# Patient Record
Sex: Female | Born: 1945 | Race: White | Hispanic: No
Health system: Southern US, Community
[De-identification: ages and names within clinical notes are randomized; demographics above are authoritative.]

---

## 1997-11-24 ENCOUNTER — Emergency Department (HOSPITAL_COMMUNITY): Admission: EM | Admit: 1997-11-24 | Discharge: 1997-11-24 | Payer: Self-pay

## 1997-12-31 ENCOUNTER — Emergency Department (HOSPITAL_COMMUNITY): Admission: EM | Admit: 1997-12-31 | Discharge: 1997-12-31 | Payer: Self-pay | Admitting: Emergency Medicine

## 1999-03-03 ENCOUNTER — Emergency Department (HOSPITAL_COMMUNITY): Admission: EM | Admit: 1999-03-03 | Discharge: 1999-03-03 | Payer: Self-pay | Admitting: Emergency Medicine

## 1999-03-03 ENCOUNTER — Encounter: Payer: Self-pay | Admitting: Emergency Medicine

## 2004-04-08 ENCOUNTER — Emergency Department: Payer: Self-pay | Admitting: General Practice

## 2004-04-20 ENCOUNTER — Emergency Department: Payer: Self-pay | Admitting: Internal Medicine

## 2004-04-25 ENCOUNTER — Emergency Department: Payer: Self-pay | Admitting: Emergency Medicine

## 2004-05-10 ENCOUNTER — Emergency Department: Payer: Self-pay | Admitting: Emergency Medicine

## 2004-05-28 ENCOUNTER — Inpatient Hospital Stay: Payer: Self-pay

## 2004-05-29 ENCOUNTER — Other Ambulatory Visit: Payer: Self-pay

## 2004-06-10 ENCOUNTER — Inpatient Hospital Stay: Payer: Self-pay | Admitting: Internal Medicine

## 2004-08-10 ENCOUNTER — Emergency Department: Payer: Self-pay | Admitting: General Practice

## 2004-08-23 ENCOUNTER — Inpatient Hospital Stay: Payer: Self-pay

## 2004-09-08 ENCOUNTER — Inpatient Hospital Stay: Payer: Self-pay | Admitting: Anesthesiology

## 2004-09-21 ENCOUNTER — Emergency Department: Payer: Self-pay | Admitting: Emergency Medicine

## 2004-10-30 ENCOUNTER — Emergency Department: Payer: Self-pay | Admitting: Internal Medicine

## 2004-11-17 ENCOUNTER — Emergency Department: Payer: Self-pay | Admitting: Emergency Medicine

## 2005-02-09 ENCOUNTER — Emergency Department: Payer: Self-pay | Admitting: Emergency Medicine

## 2005-05-13 ENCOUNTER — Emergency Department: Payer: Self-pay | Admitting: Unknown Physician Specialty

## 2005-05-14 ENCOUNTER — Other Ambulatory Visit: Payer: Self-pay

## 2005-05-14 ENCOUNTER — Inpatient Hospital Stay: Payer: Self-pay | Admitting: Unknown Physician Specialty

## 2006-08-15 ENCOUNTER — Emergency Department: Payer: Self-pay | Admitting: Emergency Medicine

## 2008-10-09 ENCOUNTER — Emergency Department: Payer: Self-pay

## 2009-01-12 ENCOUNTER — Ambulatory Visit: Payer: Self-pay | Admitting: Family Medicine

## 2009-01-14 ENCOUNTER — Ambulatory Visit: Payer: Self-pay | Admitting: Family Medicine

## 2009-02-05 ENCOUNTER — Ambulatory Visit: Payer: Self-pay | Admitting: General Surgery

## 2009-02-13 ENCOUNTER — Ambulatory Visit: Payer: Self-pay | Admitting: General Surgery

## 2009-02-16 ENCOUNTER — Ambulatory Visit: Payer: Self-pay | Admitting: General Surgery

## 2009-02-17 ENCOUNTER — Ambulatory Visit: Payer: Self-pay | Admitting: General Surgery

## 2011-02-09 ENCOUNTER — Emergency Department: Payer: Self-pay | Admitting: Internal Medicine

## 2011-12-05 ENCOUNTER — Ambulatory Visit: Payer: Self-pay | Admitting: Internal Medicine

## 2011-12-28 ENCOUNTER — Inpatient Hospital Stay: Payer: Self-pay | Admitting: Internal Medicine

## 2011-12-28 LAB — CBC
HCT: 35.5 % (ref 35.0–47.0)
HGB: 11.3 g/dL — ABNORMAL LOW (ref 12.0–16.0)
MCH: 29.2 pg (ref 26.0–34.0)
MCHC: 31.9 g/dL — ABNORMAL LOW (ref 32.0–36.0)
MCV: 92 fL (ref 80–100)
Platelet: 258 10*3/uL (ref 150–440)
RBC: 3.88 10*6/uL (ref 3.80–5.20)
RDW: 13.5 % (ref 11.5–14.5)
WBC: 26.4 10*3/uL — ABNORMAL HIGH (ref 3.6–11.0)

## 2011-12-28 LAB — COMPREHENSIVE METABOLIC PANEL
Albumin: 2 g/dL — ABNORMAL LOW (ref 3.4–5.0)
Alkaline Phosphatase: 105 U/L (ref 50–136)
Anion Gap: 9 (ref 7–16)
BUN: 7 mg/dL (ref 7–18)
Bilirubin,Total: 0.4 mg/dL (ref 0.2–1.0)
Calcium, Total: 8.2 mg/dL — ABNORMAL LOW (ref 8.5–10.1)
Chloride: 96 mmol/L — ABNORMAL LOW (ref 98–107)
Co2: 28 mmol/L (ref 21–32)
Creatinine: 0.51 mg/dL — ABNORMAL LOW (ref 0.60–1.30)
EGFR (African American): >60
EGFR (Non-African Amer.): >60
Glucose: 113 mg/dL — ABNORMAL HIGH (ref 65–99)
Osmolality: 265 (ref 275–301)
Potassium: 3 mmol/L — ABNORMAL LOW (ref 3.5–5.1)
SGOT(AST): 45 U/L — ABNORMAL HIGH (ref 15–37)
SGPT (ALT): 30 U/L
Sodium: 133 mmol/L — ABNORMAL LOW (ref 136–145)
Total Protein: 6.9 g/dL (ref 6.4–8.2)

## 2011-12-28 LAB — URINALYSIS, COMPLETE
Bilirubin,UR: NEGATIVE
Glucose,UR: NEGATIVE mg/dL (ref 0–75)
Ketone: NEGATIVE
Nitrite: NEGATIVE
Ph: 6 (ref 4.5–8.0)
Protein: NEGATIVE
RBC,UR: 1 /HPF (ref 0–5)
Specific Gravity: 1.01 (ref 1.003–1.030)
Squamous Epithelial: 6
WBC UR: 20 /HPF (ref 0–5)

## 2011-12-28 LAB — PROTIME-INR
INR: 1.2
Prothrombin Time: 15.5 secs — ABNORMAL HIGH (ref 11.5–14.7)

## 2011-12-28 LAB — APTT: Activated PTT: 30.5 secs (ref 23.6–35.9)

## 2011-12-29 LAB — CBC WITH DIFFERENTIAL/PLATELET
Comment - H1-Com1: NORMAL
Eosinophil: 1 %
HCT: 34.3 % — ABNORMAL LOW (ref 35.0–47.0)
HGB: 11.1 g/dL — ABNORMAL LOW (ref 12.0–16.0)
Lymphocytes: 12 %
MCH: 30.2 pg (ref 26.0–34.0)
MCHC: 32.5 g/dL (ref 32.0–36.0)
MCV: 93 fL (ref 80–100)
Monocytes: 10 %
Platelet: 255 10*3/uL (ref 150–440)
RBC: 3.69 10*6/uL — ABNORMAL LOW (ref 3.80–5.20)
RDW: 13.5 % (ref 11.5–14.5)
Segmented Neutrophils: 77 %
WBC: 22.5 10*3/uL — ABNORMAL HIGH (ref 3.6–11.0)

## 2011-12-29 LAB — BASIC METABOLIC PANEL
Anion Gap: 8 (ref 7–16)
BUN: 7 mg/dL (ref 7–18)
Calcium, Total: 8.3 mg/dL — ABNORMAL LOW (ref 8.5–10.1)
Chloride: 104 mmol/L (ref 98–107)
Co2: 26 mmol/L (ref 21–32)
Creatinine: 0.47 mg/dL — ABNORMAL LOW (ref 0.60–1.30)
EGFR (African American): >60
EGFR (Non-African Amer.): >60
Glucose: 101 mg/dL — ABNORMAL HIGH (ref 65–99)
Osmolality: 274 (ref 275–301)
Potassium: 4 mmol/L (ref 3.5–5.1)
Sodium: 138 mmol/L (ref 136–145)

## 2011-12-29 LAB — MAGNESIUM: Magnesium: 1.5 mg/dL — ABNORMAL LOW

## 2011-12-29 LAB — APTT: Activated PTT: 35.6 secs (ref 23.6–35.9)

## 2011-12-30 LAB — BASIC METABOLIC PANEL WITH GFR
Anion Gap: 6 — ABNORMAL LOW (ref 7–16)
BUN: 9 mg/dL (ref 7–18)
Calcium, Total: 8.1 mg/dL — ABNORMAL LOW (ref 8.5–10.1)
Chloride: 101 mmol/L (ref 98–107)
Co2: 31 mmol/L (ref 21–32)
Creatinine: 0.52 mg/dL — ABNORMAL LOW (ref 0.60–1.30)
EGFR (African American): >60
EGFR (Non-African Amer.): >60
Glucose: 104 mg/dL — ABNORMAL HIGH (ref 65–99)
Osmolality: 275 (ref 275–301)
Potassium: 3.9 mmol/L (ref 3.5–5.1)
Sodium: 138 mmol/L (ref 136–145)

## 2011-12-30 LAB — FERRITIN: Ferritin (ARMC): 712 ng/mL — ABNORMAL HIGH (ref 8–388)

## 2011-12-30 LAB — IRON AND TIBC
Iron Bind.Cap.(Total): 190 ug/dL — ABNORMAL LOW (ref 250–450)
Iron Saturation: 16 %
Iron: 31 ug/dL — ABNORMAL LOW (ref 50–170)

## 2011-12-30 LAB — CBC WITH DIFFERENTIAL/PLATELET
Basophil #: 0.1 x10 3/mm 3 (ref 0.0–0.1)
Basophil %: 0.5 %
Eosinophil #: 0.1 x10 3/mm 3 (ref 0.0–0.7)
Eosinophil %: 0.5 %
HCT: 33 % — ABNORMAL LOW (ref 35.0–47.0)
HGB: 11 g/dL — ABNORMAL LOW (ref 12.0–16.0)
Lymphocyte %: 12.3 %
Lymphs Abs: 2.8 x10 3/mm 3 (ref 1.0–3.6)
MCH: 30.5 pg (ref 26.0–34.0)
MCHC: 33.5 g/dL (ref 32.0–36.0)
MCV: 91 fL (ref 80–100)
Monocyte #: 2.3 x10 3/mm — ABNORMAL HIGH (ref 0.2–0.9)
Monocyte %: 10 %
Neutrophil #: 17.2 x10 3/mm 3 — ABNORMAL HIGH (ref 1.4–6.5)
Neutrophil %: 76.7 %
Platelet: 297 x10 3/mm 3 (ref 150–440)
RBC: 3.62 X10 6/mm 3 — ABNORMAL LOW (ref 3.80–5.20)
RDW: 13.8 % (ref 11.5–14.5)
WBC: 22.5 x10 3/mm 3 — ABNORMAL HIGH (ref 3.6–11.0)

## 2011-12-30 LAB — PROTIME-INR
INR: 1.1
Prothrombin Time: 14.4 s (ref 11.5–14.7)

## 2011-12-30 LAB — AFP TUMOR MARKER: AFP-Tumor Marker: 1.7 ng/mL (ref 0.0–8.3)

## 2011-12-30 LAB — URINE CULTURE

## 2011-12-31 LAB — PROTIME-INR: INR: 1.3

## 2011-12-31 LAB — APTT: Activated PTT: 95.6 secs — ABNORMAL HIGH (ref 23.6–35.9)

## 2012-01-01 LAB — BASIC METABOLIC PANEL
Anion Gap: 11 (ref 7–16)
BUN: 10 mg/dL (ref 7–18)
Calcium, Total: 8.1 mg/dL — ABNORMAL LOW (ref 8.5–10.1)
Co2: 26 mmol/L (ref 21–32)
EGFR (African American): >60
EGFR (Non-African Amer.): >60
Glucose: 110 mg/dL — ABNORMAL HIGH (ref 65–99)
Sodium: 136 mmol/L (ref 136–145)

## 2012-01-01 LAB — APTT
Activated PTT: >160 secs (ref 23.6–35.9)
Activated PTT: 127.9 secs — ABNORMAL HIGH (ref 23.6–35.9)

## 2012-01-01 LAB — PROTIME-INR: Prothrombin Time: 20.1 secs — ABNORMAL HIGH (ref 11.5–14.7)

## 2012-01-01 LAB — HEMOGLOBIN: HGB: 10.8 g/dL — ABNORMAL LOW (ref 12.0–16.0)

## 2012-01-02 LAB — APTT
Activated PTT: >160 secs (ref 23.6–35.9)
Activated PTT: 70.2 secs — ABNORMAL HIGH (ref 23.6–35.9)

## 2012-01-03 LAB — APTT
Activated PTT: 149.8 secs — ABNORMAL HIGH (ref 23.6–35.9)
Activated PTT: 109 secs — ABNORMAL HIGH (ref 23.6–35.9)

## 2012-01-03 LAB — URINALYSIS, COMPLETE
Bilirubin,UR: NEGATIVE
Ketone: NEGATIVE
Leukocyte Esterase: NEGATIVE
Protein: NEGATIVE
RBC,UR: 1 /HPF (ref 0–5)
Squamous Epithelial: 1
WBC UR: 2 /HPF (ref 0–5)

## 2012-01-03 LAB — PLATELET COUNT: Platelet: 397 10*3/uL (ref 150–440)

## 2012-01-03 LAB — HEMOGLOBIN: HGB: 10.8 g/dL — ABNORMAL LOW (ref 12.0–16.0)

## 2012-01-04 LAB — CBC WITH DIFFERENTIAL/PLATELET
Basophil %: 0.4 %
Eosinophil #: 0.2 10*3/uL (ref 0.0–0.7)
Eosinophil %: 0.8 %
HCT: 29.1 % — ABNORMAL LOW (ref 35.0–47.0)
HGB: 10 g/dL — ABNORMAL LOW (ref 12.0–16.0)
Lymphocyte #: 2.6 10*3/uL (ref 1.0–3.6)
MCH: 31.6 pg (ref 26.0–34.0)
MCHC: 34.4 g/dL (ref 32.0–36.0)
MCV: 92 fL (ref 80–100)
Monocyte #: 1.6 x10 3/mm — ABNORMAL HIGH (ref 0.2–0.9)
Neutrophil #: 17.7 10*3/uL — ABNORMAL HIGH (ref 1.4–6.5)
Neutrophil %: 79.8 %
Platelet: 395 10*3/uL (ref 150–440)
RBC: 3.17 10*6/uL — ABNORMAL LOW (ref 3.80–5.20)
RDW: 13.9 % (ref 11.5–14.5)

## 2012-01-04 LAB — PROTIME-INR
INR: 2.6
Prothrombin Time: 27.8 secs — ABNORMAL HIGH (ref 11.5–14.7)

## 2012-01-04 LAB — URINE CULTURE

## 2012-01-04 LAB — APTT: Activated PTT: >160 secs (ref 23.6–35.9)

## 2012-01-06 ENCOUNTER — Ambulatory Visit: Payer: Self-pay | Admitting: Internal Medicine

## 2012-01-06 LAB — CBC CANCER CENTER
Basophil #: 0.1 x10 3/mm (ref 0.0–0.1)
Basophil %: 0.4 %
Eosinophil #: 0.1 x10 3/mm (ref 0.0–0.7)
HCT: 34.2 % — ABNORMAL LOW (ref 35.0–47.0)
Lymphocyte %: 8.4 %
MCHC: 32.2 g/dL (ref 32.0–36.0)
MCV: 93 fL (ref 80–100)
Monocyte %: 6.4 %
Neutrophil #: 27.2 x10 3/mm — ABNORMAL HIGH (ref 1.4–6.5)
Platelet: 515 x10 3/mm — ABNORMAL HIGH (ref 150–440)
RDW: 14.2 % (ref 11.5–14.5)

## 2012-01-06 LAB — HEPATIC FUNCTION PANEL A (ARMC)
Alkaline Phosphatase: 129 U/L (ref 50–136)
Bilirubin,Total: 0.5 mg/dL (ref 0.2–1.0)
SGPT (ALT): 17 U/L

## 2012-01-06 LAB — PROTIME-INR
INR: 2.3
Prothrombin Time: 25.3 secs — ABNORMAL HIGH (ref 11.5–14.7)

## 2012-01-08 LAB — CULTURE, BLOOD (SINGLE)

## 2012-01-09 LAB — PROTIME-INR
INR: 2.6
Prothrombin Time: 27.6 secs — ABNORMAL HIGH (ref 11.5–14.7)

## 2012-01-09 LAB — CREATININE, SERUM
Creatinine: 0.6 mg/dL (ref 0.60–1.30)
EGFR (African American): >60
EGFR (Non-African Amer.): >60

## 2012-01-09 LAB — CANCER CTR PLATELET CT: Platelet: 426 x10 3/mm (ref 150–440)

## 2012-01-12 LAB — PROTIME-INR: INR: 2.7

## 2012-01-13 ENCOUNTER — Inpatient Hospital Stay: Payer: Self-pay | Admitting: Hematology & Oncology

## 2012-01-13 LAB — CBC WITH DIFFERENTIAL/PLATELET
Basophil #: 0.2 10*3/uL — ABNORMAL HIGH (ref 0.0–0.1)
Eosinophil #: 0.1 10*3/uL (ref 0.0–0.7)
HCT: 30.1 % — ABNORMAL LOW (ref 35.0–47.0)
Lymphocyte #: 1.9 10*3/uL (ref 1.0–3.6)
Lymphocyte %: 6.7 %
MCHC: 32.8 g/dL (ref 32.0–36.0)
MCV: 93 fL (ref 80–100)
Neutrophil #: 24.3 10*3/uL — ABNORMAL HIGH (ref 1.4–6.5)
Platelet: 272 10*3/uL (ref 150–440)
RBC: 3.24 10*6/uL — ABNORMAL LOW (ref 3.80–5.20)
RDW: 13.8 % (ref 11.5–14.5)

## 2012-01-13 LAB — COMPREHENSIVE METABOLIC PANEL
Albumin: 1.5 g/dL — ABNORMAL LOW (ref 3.4–5.0)
Alkaline Phosphatase: 113 U/L (ref 50–136)
Anion Gap: 8 (ref 7–16)
Calcium, Total: 7.7 mg/dL — ABNORMAL LOW (ref 8.5–10.1)
Co2: 27 mmol/L (ref 21–32)
EGFR (African American): >60
EGFR (Non-African Amer.): >60
Glucose: 91 mg/dL (ref 65–99)
Osmolality: 266 (ref 275–301)
Potassium: 3.7 mmol/L (ref 3.5–5.1)
SGOT(AST): 40 U/L — ABNORMAL HIGH (ref 15–37)
Sodium: 134 mmol/L — ABNORMAL LOW (ref 136–145)

## 2012-01-13 LAB — PROTIME-INR: Prothrombin Time: 24.2 secs — ABNORMAL HIGH (ref 11.5–14.7)

## 2012-01-16 LAB — BASIC METABOLIC PANEL
BUN: 10 mg/dL (ref 7–18)
Calcium, Total: 8.1 mg/dL — ABNORMAL LOW (ref 8.5–10.1)
Creatinine: 0.49 mg/dL — ABNORMAL LOW (ref 0.60–1.30)
EGFR (African American): >60
Glucose: 150 mg/dL — ABNORMAL HIGH (ref 65–99)
Osmolality: 291 (ref 275–301)
Sodium: 145 mmol/L (ref 136–145)

## 2012-01-16 LAB — CBC WITH DIFFERENTIAL/PLATELET
Basophil #: 0.1 10*3/uL (ref 0.0–0.1)
Eosinophil #: 0 10*3/uL (ref 0.0–0.7)
Eosinophil %: 0 %
Lymphocyte %: 5.1 %
Monocyte %: 3.1 %
Neutrophil #: 17.9 10*3/uL — ABNORMAL HIGH (ref 1.4–6.5)
Neutrophil %: 91.4 %
Platelet: 399 10*3/uL (ref 150–440)
RBC: 3.23 10*6/uL — ABNORMAL LOW (ref 3.80–5.20)
RDW: 14.2 % (ref 11.5–14.5)
WBC: 19.6 10*3/uL — ABNORMAL HIGH (ref 3.6–11.0)

## 2012-01-17 LAB — CBC WITH DIFFERENTIAL/PLATELET
Basophil #: 0.1 10*3/uL (ref 0.0–0.1)
Basophil %: 0.4 %
Eosinophil #: 0 10*3/uL (ref 0.0–0.7)
HGB: 9.7 g/dL — ABNORMAL LOW (ref 12.0–16.0)
Lymphocyte %: 5.5 %
MCH: 30.8 pg (ref 26.0–34.0)
MCHC: 33.6 g/dL (ref 32.0–36.0)
MCV: 92 fL (ref 80–100)
Monocyte #: 0.7 x10 3/mm (ref 0.2–0.9)
Neutrophil %: 90.3 %
Platelet: 418 10*3/uL (ref 150–440)
RDW: 14.3 % (ref 11.5–14.5)

## 2012-01-18 LAB — CBC WITH DIFFERENTIAL/PLATELET
Basophil #: 0.1 10*3/uL (ref 0.0–0.1)
Eosinophil #: 0 10*3/uL (ref 0.0–0.7)
Eosinophil %: 0 %
Lymphocyte #: 2.7 10*3/uL (ref 1.0–3.6)
Lymphocyte %: 12.1 %
MCH: 29.9 pg (ref 26.0–34.0)
MCHC: 32.5 g/dL (ref 32.0–36.0)
MCV: 92 fL (ref 80–100)
Monocyte #: 1.3 x10 3/mm — ABNORMAL HIGH (ref 0.2–0.9)
Neutrophil #: 17.9 10*3/uL — ABNORMAL HIGH (ref 1.4–6.5)
Neutrophil %: 81.3 %
Platelet: 409 10*3/uL (ref 150–440)
RBC: 3.1 10*6/uL — ABNORMAL LOW (ref 3.80–5.20)
RDW: 13.9 % (ref 11.5–14.5)
WBC: 22 10*3/uL — ABNORMAL HIGH (ref 3.6–11.0)

## 2012-01-18 LAB — COMPREHENSIVE METABOLIC PANEL
Anion Gap: 7 (ref 7–16)
BUN: 12 mg/dL (ref 7–18)
Chloride: 109 mmol/L — ABNORMAL HIGH (ref 98–107)
Creatinine: 0.46 mg/dL — ABNORMAL LOW (ref 0.60–1.30)
EGFR (African American): >60
Glucose: 88 mg/dL (ref 65–99)
SGOT(AST): 44 U/L — ABNORMAL HIGH (ref 15–37)
SGPT (ALT): 22 U/L (ref 12–78)
Total Protein: 5.9 g/dL — ABNORMAL LOW (ref 6.4–8.2)

## 2012-01-18 LAB — POTASSIUM: Potassium: 5 mmol/L (ref 3.5–5.1)

## 2012-01-18 LAB — PROTIME-INR: Prothrombin Time: 16.5 secs — ABNORMAL HIGH (ref 11.5–14.7)

## 2012-01-19 LAB — CULTURE, BLOOD (SINGLE)

## 2012-01-19 LAB — CBC WITH DIFFERENTIAL/PLATELET
HGB: 10 g/dL — ABNORMAL LOW (ref 12.0–16.0)
MCH: 28.9 pg (ref 26.0–34.0)
MCHC: 31.1 g/dL — ABNORMAL LOW (ref 32.0–36.0)
Platelet: 394 10*3/uL (ref 150–440)
Segmented Neutrophils: 88 %

## 2012-01-20 LAB — CULTURE, FUNGUS WITHOUT SMEAR

## 2012-01-23 LAB — CBC CANCER CENTER
Basophil #: 0.1 x10 3/mm (ref 0.0–0.1)
HCT: 32.3 % — ABNORMAL LOW (ref 35.0–47.0)
Lymphocyte %: 3.1 %
MCHC: 31.9 g/dL — ABNORMAL LOW (ref 32.0–36.0)
MCV: 92 fL (ref 80–100)
Monocyte %: 2.7 %
Neutrophil %: 93.2 %
Platelet: 310 x10 3/mm (ref 150–440)
RDW: 14.3 % (ref 11.5–14.5)
WBC: 21.3 x10 3/mm — ABNORMAL HIGH (ref 3.6–11.0)

## 2012-01-23 LAB — PROTIME-INR
INR: 1
Prothrombin Time: 13.7 secs (ref 11.5–14.7)

## 2012-01-30 LAB — CBC CANCER CENTER
Basophil #: 0.1 x10 3/mm (ref 0.0–0.1)
Eosinophil #: 0.3 x10 3/mm (ref 0.0–0.7)
HGB: 11.2 g/dL — ABNORMAL LOW (ref 12.0–16.0)
Lymphocyte %: 13.2 %
MCH: 30 pg (ref 26.0–34.0)
MCHC: 32.2 g/dL (ref 32.0–36.0)
Monocyte %: 11.8 %
Neutrophil #: 6.1 x10 3/mm (ref 1.4–6.5)
Neutrophil %: 70.9 %
Platelet: 322 x10 3/mm (ref 150–440)
RBC: 3.72 10*6/uL — ABNORMAL LOW (ref 3.80–5.20)
RDW: 15.3 % — ABNORMAL HIGH (ref 11.5–14.5)
WBC: 8.6 x10 3/mm (ref 3.6–11.0)

## 2012-02-05 ENCOUNTER — Ambulatory Visit: Payer: Self-pay | Admitting: Internal Medicine

## 2012-02-07 LAB — CREATININE, SERUM
Creatinine: 0.52 mg/dL — ABNORMAL LOW (ref 0.60–1.30)
EGFR (African American): >60
EGFR (Non-African Amer.): >60

## 2012-02-07 LAB — CBC CANCER CENTER
Basophil #: 0.1 x10 3/mm (ref 0.0–0.1)
Basophil %: 1 %
Eosinophil #: 0 x10 3/mm (ref 0.0–0.7)
Eosinophil %: 0.2 %
HGB: 11.4 g/dL — ABNORMAL LOW (ref 12.0–16.0)
Lymphocyte #: 0.8 x10 3/mm — ABNORMAL LOW (ref 1.0–3.6)
Lymphocyte %: 11.8 %
MCHC: 32.6 g/dL (ref 32.0–36.0)
Neutrophil %: 78.6 %
Platelet: 355 x10 3/mm (ref 150–440)
RBC: 3.76 10*6/uL — ABNORMAL LOW (ref 3.80–5.20)
RDW: 16.2 % — ABNORMAL HIGH (ref 11.5–14.5)

## 2012-02-07 LAB — HEPATIC FUNCTION PANEL A (ARMC)
Albumin: 3.2 g/dL — ABNORMAL LOW (ref 3.4–5.0)
Bilirubin, Direct: <0.1 mg/dL (ref 0.00–0.20)
Bilirubin,Total: 0.3 mg/dL (ref 0.2–1.0)
SGOT(AST): 83 U/L — ABNORMAL HIGH (ref 15–37)
Total Protein: 8.1 g/dL (ref 6.4–8.2)

## 2012-02-13 LAB — CBC CANCER CENTER
Basophil #: 0 x10 3/mm (ref 0.0–0.1)
Eosinophil #: 0.3 x10 3/mm (ref 0.0–0.7)
Eosinophil %: 5.4 %
Lymphocyte #: 1 x10 3/mm (ref 1.0–3.6)
Lymphocyte %: 18.1 %
MCH: 30.2 pg (ref 26.0–34.0)
MCHC: 32.3 g/dL (ref 32.0–36.0)
Monocyte #: 1.1 x10 3/mm — ABNORMAL HIGH (ref 0.2–0.9)
Neutrophil %: 57.5 %
Platelet: 244 x10 3/mm (ref 150–440)
RDW: 16.2 % — ABNORMAL HIGH (ref 11.5–14.5)
WBC: 5.7 x10 3/mm (ref 3.6–11.0)

## 2012-02-20 LAB — CBC CANCER CENTER
Basophil %: 0.8 %
Eosinophil #: 0.2 x10 3/mm (ref 0.0–0.7)
Eosinophil %: 3.4 %
HCT: 36.1 % (ref 35.0–47.0)
HGB: 11.7 g/dL — ABNORMAL LOW (ref 12.0–16.0)
Lymphocyte %: 13.4 %
MCH: 30.1 pg (ref 26.0–34.0)
MCHC: 32.4 g/dL (ref 32.0–36.0)
Neutrophil #: 4.4 x10 3/mm (ref 1.4–6.5)
Neutrophil %: 65.6 %

## 2012-03-06 ENCOUNTER — Ambulatory Visit: Payer: Self-pay | Admitting: Internal Medicine

## 2012-03-26 LAB — CBC CANCER CENTER
Basophil #: 0.1 x10 3/mm (ref 0.0–0.1)
Basophil %: 0.6 %
HCT: 36.9 % (ref 35.0–47.0)
HGB: 12 g/dL (ref 12.0–16.0)
Lymphocyte %: 5.3 %
Monocyte #: 1.5 x10 3/mm — ABNORMAL HIGH (ref 0.2–0.9)
Monocyte %: 7.3 %
Neutrophil #: 17.8 x10 3/mm — ABNORMAL HIGH (ref 1.4–6.5)
Neutrophil %: 85.7 %
Platelet: 488 x10 3/mm — ABNORMAL HIGH (ref 150–440)
RDW: 14 % (ref 11.5–14.5)
WBC: 20.8 x10 3/mm — ABNORMAL HIGH (ref 3.6–11.0)

## 2012-03-26 LAB — HEPATIC FUNCTION PANEL A (ARMC)
SGOT(AST): 40 U/L — ABNORMAL HIGH (ref 15–37)
SGPT (ALT): 25 U/L (ref 12–78)
Total Protein: 8.3 g/dL — ABNORMAL HIGH (ref 6.4–8.2)

## 2012-03-26 LAB — CREATININE, SERUM
Creatinine: 0.6 mg/dL (ref 0.60–1.30)
EGFR (African American): >60
EGFR (Non-African Amer.): >60

## 2012-04-02 LAB — CBC CANCER CENTER
Basophil #: 0.1 x10 3/mm (ref 0.0–0.1)
Basophil %: 0.3 %
Eosinophil #: 0.2 x10 3/mm (ref 0.0–0.7)
Eosinophil %: 0.8 %
Lymphocyte #: 0.8 x10 3/mm — ABNORMAL LOW (ref 1.0–3.6)
MCH: 29.3 pg (ref 26.0–34.0)
MCHC: 31.8 g/dL — ABNORMAL LOW (ref 32.0–36.0)
MCV: 92 fL (ref 80–100)
Monocyte #: 1.9 x10 3/mm — ABNORMAL HIGH (ref 0.2–0.9)
Platelet: 495 x10 3/mm — ABNORMAL HIGH (ref 150–440)
RDW: 14.1 % (ref 11.5–14.5)

## 2012-04-03 ENCOUNTER — Inpatient Hospital Stay: Payer: Self-pay | Admitting: Internal Medicine

## 2012-04-03 LAB — CBC WITH DIFFERENTIAL/PLATELET
Basophil #: 0.3 10*3/uL — ABNORMAL HIGH (ref 0.0–0.1)
Eosinophil #: 0.1 10*3/uL (ref 0.0–0.7)
HCT: 36.4 % (ref 35.0–47.0)
Lymphocyte #: 0.7 10*3/uL — ABNORMAL LOW (ref 1.0–3.6)
MCH: 28.2 pg (ref 26.0–34.0)
MCHC: 31.1 g/dL — ABNORMAL LOW (ref 32.0–36.0)
MCV: 91 fL (ref 80–100)
Monocyte #: 1.2 x10 3/mm — ABNORMAL HIGH (ref 0.2–0.9)
Neutrophil #: 24.6 10*3/uL — ABNORMAL HIGH (ref 1.4–6.5)
RBC: 4.02 10*6/uL (ref 3.80–5.20)
RDW: 14.1 % (ref 11.5–14.5)

## 2012-04-03 LAB — COMPREHENSIVE METABOLIC PANEL
Anion Gap: 8 (ref 7–16)
BUN: 9 mg/dL (ref 7–18)
Bilirubin,Total: 0.3 mg/dL (ref 0.2–1.0)
Chloride: 100 mmol/L (ref 98–107)
Co2: 29 mmol/L (ref 21–32)
Creatinine: 0.5 mg/dL — ABNORMAL LOW (ref 0.60–1.30)
Potassium: 3.4 mmol/L — ABNORMAL LOW (ref 3.5–5.1)
SGPT (ALT): 24 U/L (ref 12–78)
Sodium: 137 mmol/L (ref 136–145)
Total Protein: 8.3 g/dL — ABNORMAL HIGH (ref 6.4–8.2)

## 2012-04-04 LAB — CBC WITH DIFFERENTIAL/PLATELET
Basophil #: 0.2 10*3/uL — ABNORMAL HIGH (ref 0.0–0.1)
Basophil %: 0.8 %
Eosinophil #: 0.2 10*3/uL (ref 0.0–0.7)
Lymphocyte #: 1 10*3/uL (ref 1.0–3.6)
Lymphocyte %: 3.8 %
MCH: 29.9 pg (ref 26.0–34.0)
MCHC: 33.1 g/dL (ref 32.0–36.0)
MCV: 91 fL (ref 80–100)
Monocyte #: 1.8 x10 3/mm — ABNORMAL HIGH (ref 0.2–0.9)
Neutrophil %: 87.5 %
Platelet: 398 10*3/uL (ref 150–440)
RDW: 13.7 % (ref 11.5–14.5)

## 2012-04-04 LAB — BASIC METABOLIC PANEL
BUN: 8 mg/dL (ref 7–18)
Calcium, Total: 8.3 mg/dL — ABNORMAL LOW (ref 8.5–10.1)
Creatinine: 0.54 mg/dL — ABNORMAL LOW (ref 0.60–1.30)
EGFR (African American): >60
EGFR (Non-African Amer.): >60
Glucose: 85 mg/dL (ref 65–99)
Sodium: 137 mmol/L (ref 136–145)

## 2012-04-06 ENCOUNTER — Ambulatory Visit: Payer: Self-pay | Admitting: Internal Medicine

## 2012-04-06 LAB — CBC WITH DIFFERENTIAL/PLATELET
Basophil #: 0.1 10*3/uL (ref 0.0–0.1)
Eosinophil #: 0.3 10*3/uL (ref 0.0–0.7)
HCT: 32.1 % — ABNORMAL LOW (ref 35.0–47.0)
MCH: 30.4 pg (ref 26.0–34.0)
MCHC: 33.7 g/dL (ref 32.0–36.0)
MCV: 90 fL (ref 80–100)
Monocyte %: 7.7 %
Neutrophil #: 21.3 10*3/uL — ABNORMAL HIGH (ref 1.4–6.5)
Platelet: 410 10*3/uL (ref 150–440)
RDW: 13.6 % (ref 11.5–14.5)

## 2012-04-06 LAB — URINALYSIS, COMPLETE
Bilirubin,UR: NEGATIVE
Blood: NEGATIVE
Leukocyte Esterase: NEGATIVE
Nitrite: NEGATIVE
Ph: 6 (ref 4.5–8.0)
Protein: NEGATIVE
RBC,UR: 3 /HPF (ref 0–5)
Specific Gravity: 1.016 (ref 1.003–1.030)

## 2012-04-07 LAB — URINE CULTURE

## 2012-04-07 LAB — BASIC METABOLIC PANEL
Anion Gap: 10 (ref 7–16)
BUN: 7 mg/dL (ref 7–18)
Chloride: 104 mmol/L (ref 98–107)
Creatinine: 0.47 mg/dL — ABNORMAL LOW (ref 0.60–1.30)
EGFR (African American): >60
EGFR (Non-African Amer.): >60
Glucose: 86 mg/dL (ref 65–99)
Osmolality: 271 (ref 275–301)
Potassium: 3.8 mmol/L (ref 3.5–5.1)
Sodium: 137 mmol/L (ref 136–145)

## 2012-04-07 LAB — CBC WITH DIFFERENTIAL/PLATELET
Basophil #: 0 10*3/uL (ref 0.0–0.1)
Eosinophil #: 0.4 10*3/uL (ref 0.0–0.7)
Lymphocyte #: 0.8 10*3/uL — ABNORMAL LOW (ref 1.0–3.6)
MCH: 30 pg (ref 26.0–34.0)
MCHC: 33.1 g/dL (ref 32.0–36.0)
MCV: 91 fL (ref 80–100)
Monocyte #: 2 x10 3/mm — ABNORMAL HIGH (ref 0.2–0.9)
Neutrophil %: 87.9 %
Platelet: 451 10*3/uL — ABNORMAL HIGH (ref 150–440)
RBC: 3.69 10*6/uL — ABNORMAL LOW (ref 3.80–5.20)
RDW: 14 % (ref 11.5–14.5)
WBC: 26.4 10*3/uL — ABNORMAL HIGH (ref 3.6–11.0)

## 2012-04-09 LAB — CULTURE, BLOOD (SINGLE)

## 2012-04-09 LAB — CBC WITH DIFFERENTIAL/PLATELET
Basophil #: 0.1 10*3/uL (ref 0.0–0.1)
Eosinophil #: 0.2 10*3/uL (ref 0.0–0.7)
Lymphocyte #: 0.8 10*3/uL — ABNORMAL LOW (ref 1.0–3.6)
MCH: 29.7 pg (ref 26.0–34.0)
MCHC: 33.1 g/dL (ref 32.0–36.0)
MCV: 90 fL (ref 80–100)
Monocyte #: 1.9 x10 3/mm — ABNORMAL HIGH (ref 0.2–0.9)
Neutrophil %: 88.8 %
Platelet: 413 10*3/uL (ref 150–440)
RBC: 3.52 10*6/uL — ABNORMAL LOW (ref 3.80–5.20)
RDW: 13.8 % (ref 11.5–14.5)

## 2012-04-09 LAB — COMPREHENSIVE METABOLIC PANEL
Anion Gap: 10 (ref 7–16)
BUN: 6 mg/dL — ABNORMAL LOW (ref 7–18)
Bilirubin,Total: 0.3 mg/dL (ref 0.2–1.0)
Calcium, Total: 8.4 mg/dL — ABNORMAL LOW (ref 8.5–10.1)
Co2: 25 mmol/L (ref 21–32)
EGFR (African American): >60
Glucose: 94 mg/dL (ref 65–99)
Osmolality: 271 (ref 275–301)
Potassium: 3.3 mmol/L — ABNORMAL LOW (ref 3.5–5.1)
Total Protein: 6.6 g/dL (ref 6.4–8.2)

## 2012-04-10 LAB — MAGNESIUM: Magnesium: 1.8 mg/dL

## 2012-04-11 LAB — CBC WITH DIFFERENTIAL/PLATELET
Basophil #: 0.1 10*3/uL (ref 0.0–0.1)
Basophil %: 0.5 %
Eosinophil %: 1.6 %
HGB: 10.8 g/dL — ABNORMAL LOW (ref 12.0–16.0)
Lymphocyte #: 0.9 10*3/uL — ABNORMAL LOW (ref 1.0–3.6)
MCH: 29.8 pg (ref 26.0–34.0)
MCV: 91 fL (ref 80–100)
Monocyte #: 2 x10 3/mm — ABNORMAL HIGH (ref 0.2–0.9)
Monocyte %: 6.9 %
Platelet: 451 10*3/uL — ABNORMAL HIGH (ref 150–440)
RBC: 3.62 10*6/uL — ABNORMAL LOW (ref 3.80–5.20)
RDW: 13.7 % (ref 11.5–14.5)
WBC: 29.1 10*3/uL — ABNORMAL HIGH (ref 3.6–11.0)

## 2012-04-12 ENCOUNTER — Ambulatory Visit: Payer: Self-pay | Admitting: Internal Medicine

## 2012-04-12 LAB — CULTURE, BLOOD (SINGLE)

## 2012-04-16 LAB — CBC CANCER CENTER
Basophil #: 0.1 x10 3/mm (ref 0.0–0.1)
Basophil %: 0.1 %
Eosinophil #: 0.2 x10 3/mm (ref 0.0–0.7)
HCT: 39.2 % (ref 35.0–47.0)
Lymphocyte %: 1.9 %
MCH: 28.6 pg (ref 26.0–34.0)
MCV: 91 fL (ref 80–100)
Monocyte %: 4.2 %
Neutrophil #: 36.2 x10 3/mm — ABNORMAL HIGH (ref 1.4–6.5)
Neutrophil %: 93.3 %
Platelet: 520 x10 3/mm — ABNORMAL HIGH (ref 150–440)
RBC: 4.29 10*6/uL (ref 3.80–5.20)
RDW: 13.7 % (ref 11.5–14.5)
WBC: 38.8 x10 3/mm — ABNORMAL HIGH (ref 3.6–11.0)

## 2012-04-19 LAB — CBC CANCER CENTER
Comment - H1-Com2: NORMAL
Eosinophil: 1 %
HCT: 42.5 % (ref 35.0–47.0)
MCHC: 31.4 g/dL — ABNORMAL LOW (ref 32.0–36.0)
MCV: 92 fL (ref 80–100)
Monocytes: 5 %
Platelet: 543 x10 3/mm — ABNORMAL HIGH (ref 150–440)
RDW: 14.2 % (ref 11.5–14.5)
Segmented Neutrophils: 78 %
WBC: 56.5 x10 3/mm — ABNORMAL HIGH (ref 3.6–11.0)

## 2012-04-19 LAB — CANCER CENTER WBC: WBC: 55.3 x10 3/mm — ABNORMAL HIGH (ref 3.6–11.0)

## 2012-04-22 LAB — CULTURE, BLOOD (SINGLE)

## 2012-04-23 LAB — CBC CANCER CENTER
Bands: 12 %
Comment - H1-Com3: NORMAL
HCT: 37.5 % (ref 35.0–47.0)
HGB: 11.7 g/dL — ABNORMAL LOW (ref 12.0–16.0)
Lymphocytes: 4 %
MCH: 28.3 pg (ref 26.0–34.0)
Monocytes: 5 %
Platelet: 510 x10 3/mm — ABNORMAL HIGH (ref 150–440)
RBC: 4.13 10*6/uL (ref 3.80–5.20)
Segmented Neutrophils: 76 %
Variant Lymphocyte: 2 %
WBC: 40.8 x10 3/mm — ABNORMAL HIGH (ref 3.6–11.0)

## 2012-05-02 LAB — HEPATIC FUNCTION PANEL A (ARMC)
Albumin: 2 g/dL — ABNORMAL LOW (ref 3.4–5.0)
Bilirubin, Direct: 0.1 mg/dL (ref 0.00–0.20)
Bilirubin,Total: 0.5 mg/dL (ref 0.2–1.0)
SGPT (ALT): 30 U/L (ref 12–78)

## 2012-05-02 LAB — MAGNESIUM: Magnesium: 1.6 mg/dL — ABNORMAL LOW

## 2012-05-02 LAB — CANCER CENTER WBC: WBC: 58.8 x10 3/mm — ABNORMAL HIGH (ref 3.6–11.0)

## 2012-05-06 ENCOUNTER — Ambulatory Visit: Payer: Self-pay | Admitting: Internal Medicine

## 2012-06-06 ENCOUNTER — Ambulatory Visit: Payer: Self-pay | Admitting: Internal Medicine

## 2012-06-06 DEATH — deceased

## 2013-04-18 IMAGING — CT CT SIM MISC
1 series · 16 of 33 positions shown, 20 images · non-contrast
Comparison: none

[Series 2: — · axial · 1.17mm/px · z∈[-886,-577]mm · 16 of 107 slices shown, 20 images]
[im 4/107  mediastinal]
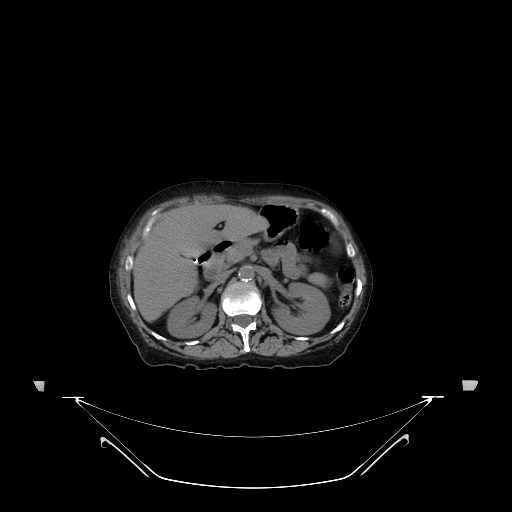
[im 4/107  lung]
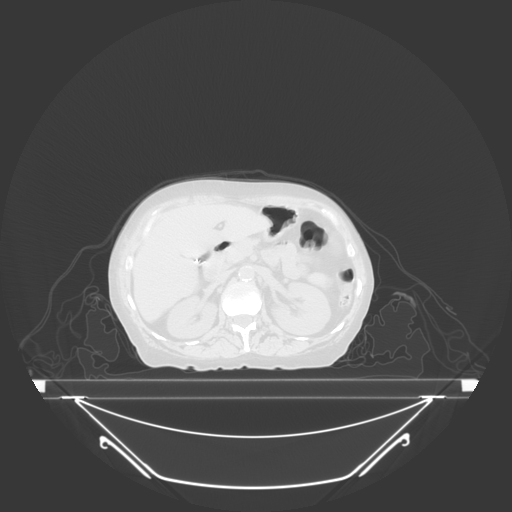
[im 12/107  lung]
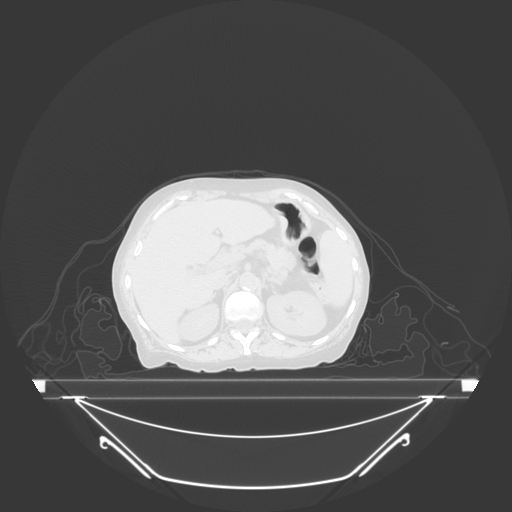
[im 20/107  lung]
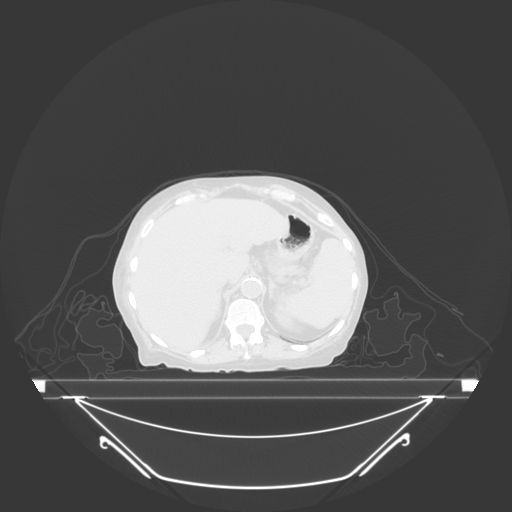
[im 24/107  lung]
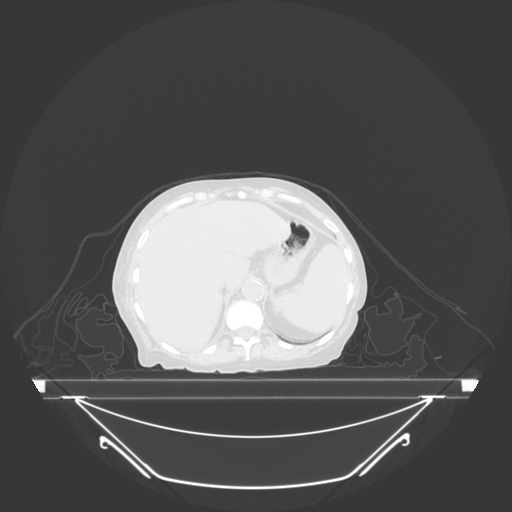
[im 32/107  mediastinal]
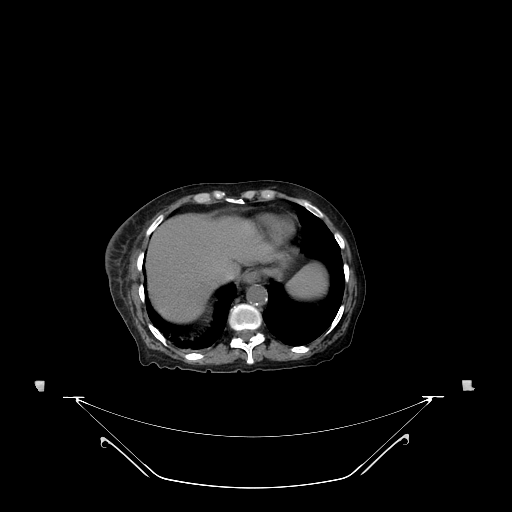
[im 32/107  lung]
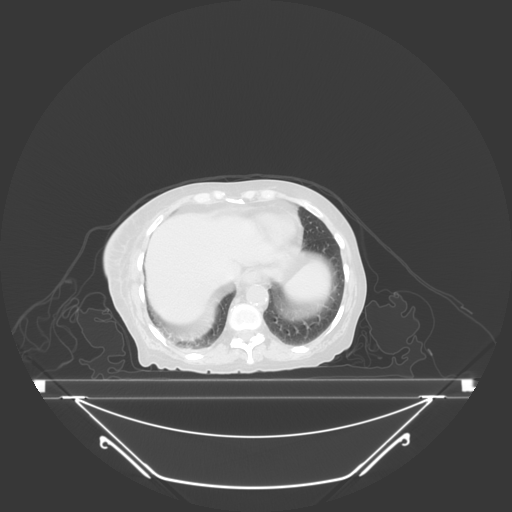
[im 40/107  lung]
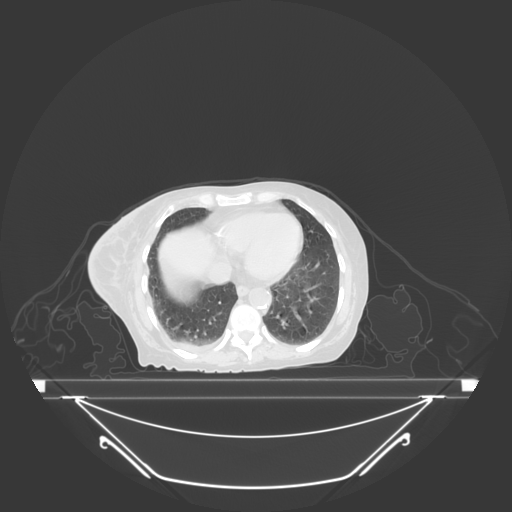
[im 44/107  lung]
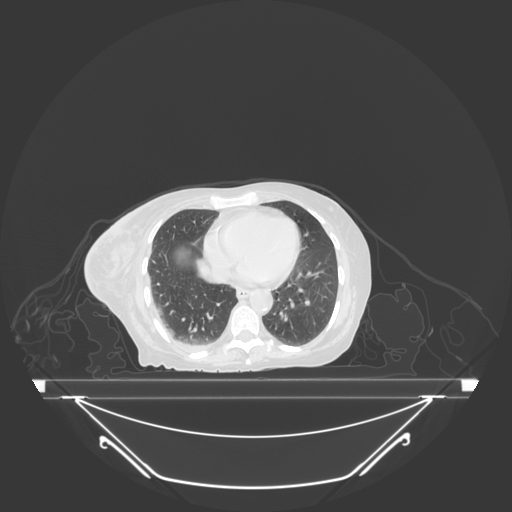
[im 52/107  lung]
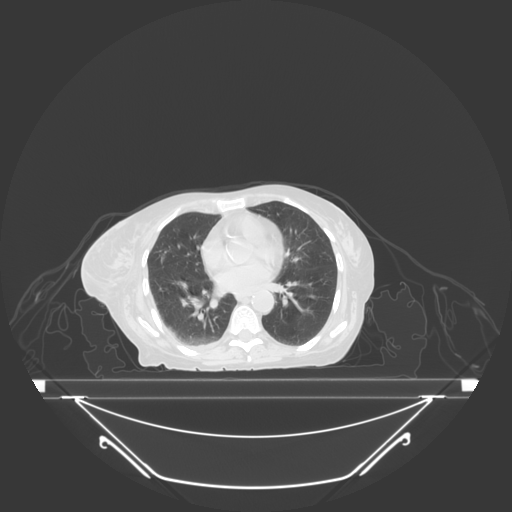
[im 55/107  mediastinal]
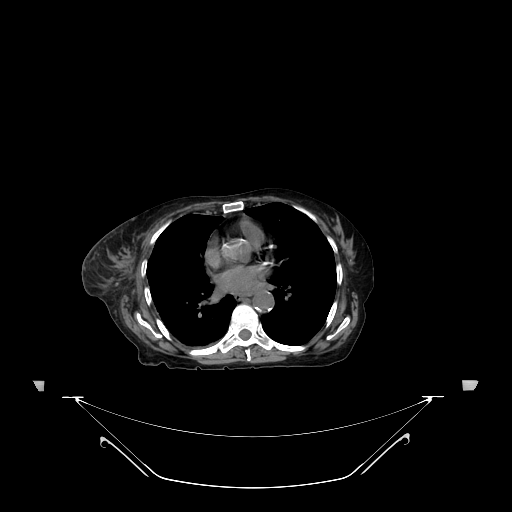
[im 55/107  lung]
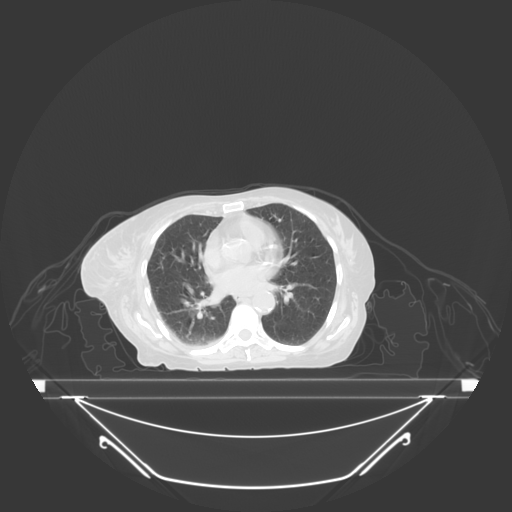
[im 63/107  lung]
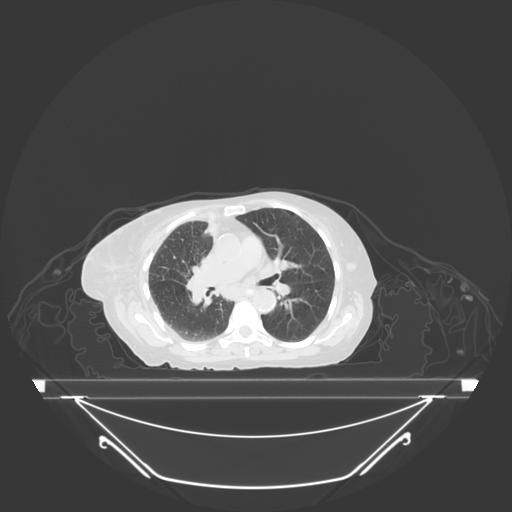
[im 67/107  lung]
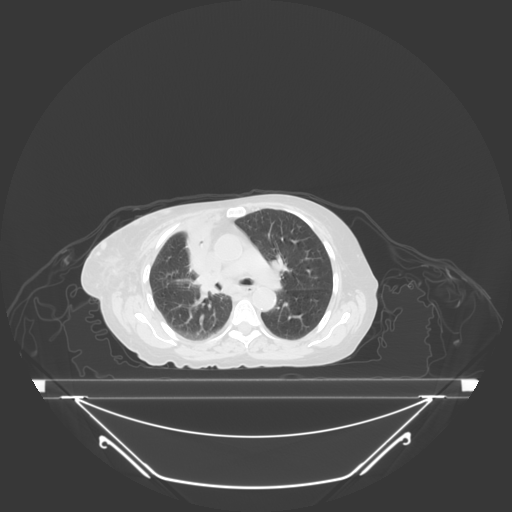
[im 75/107  lung]
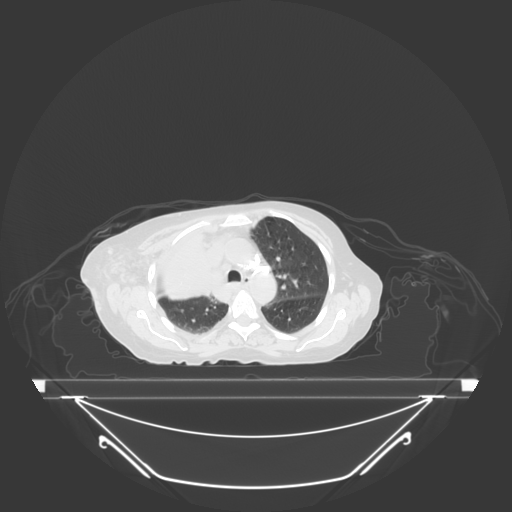
[im 83/107  mediastinal]
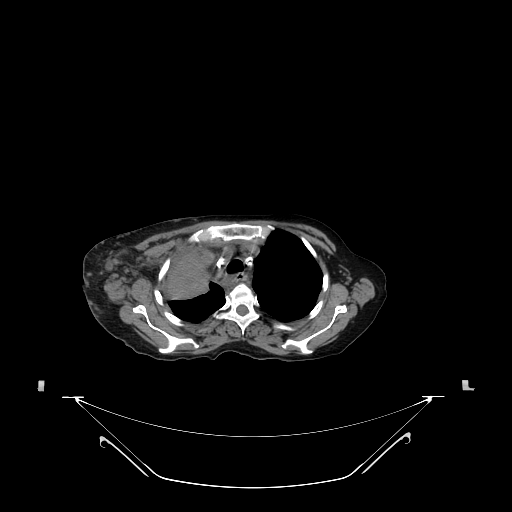
[im 83/107  lung]
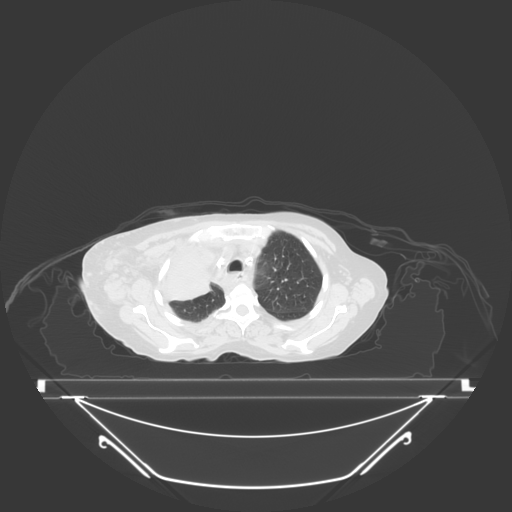
[im 87/107  lung]
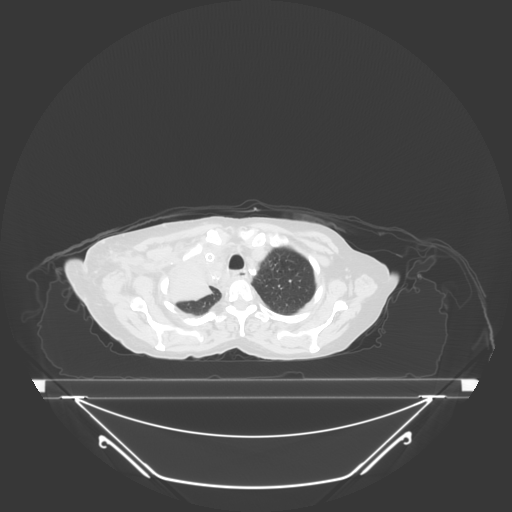
[im 95/107  lung]
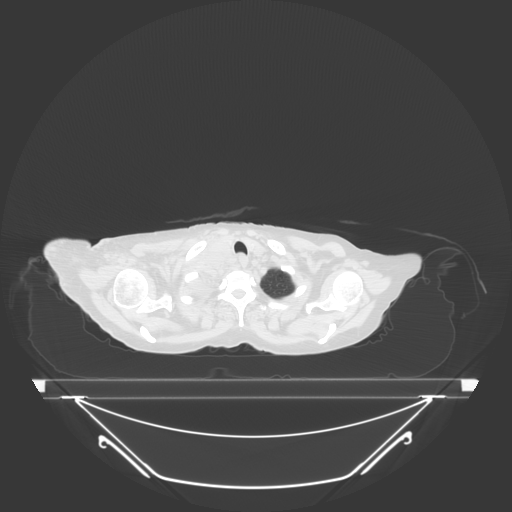
[im 103/107  lung]
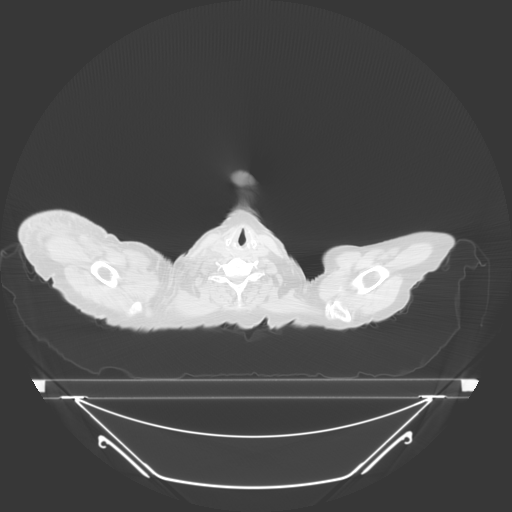

[16 of 33 positions shown; findings below may reference images not displayed]

IMAGES IMPORTED FROM THE [employer] WORK[employer]OW SYSTEM
NO DICTATION FOR STUDY

## 2013-04-19 IMAGING — CR DG CHEST 1V PORT
1 series · 1 of 1 positions shown · non-contrast
Comparison: none

REASON FOR EXAM: Pt with sepsis
COMMENTS:

PROCEDURE:     DXR - DXR PORTABLE CHEST SINGLE VIEW  - January 13, 2012  [DATE]
RESULT:     Frontal view of the chest is performed. Comparison is made to a
prior study dated 01/04/2012.

[ap]
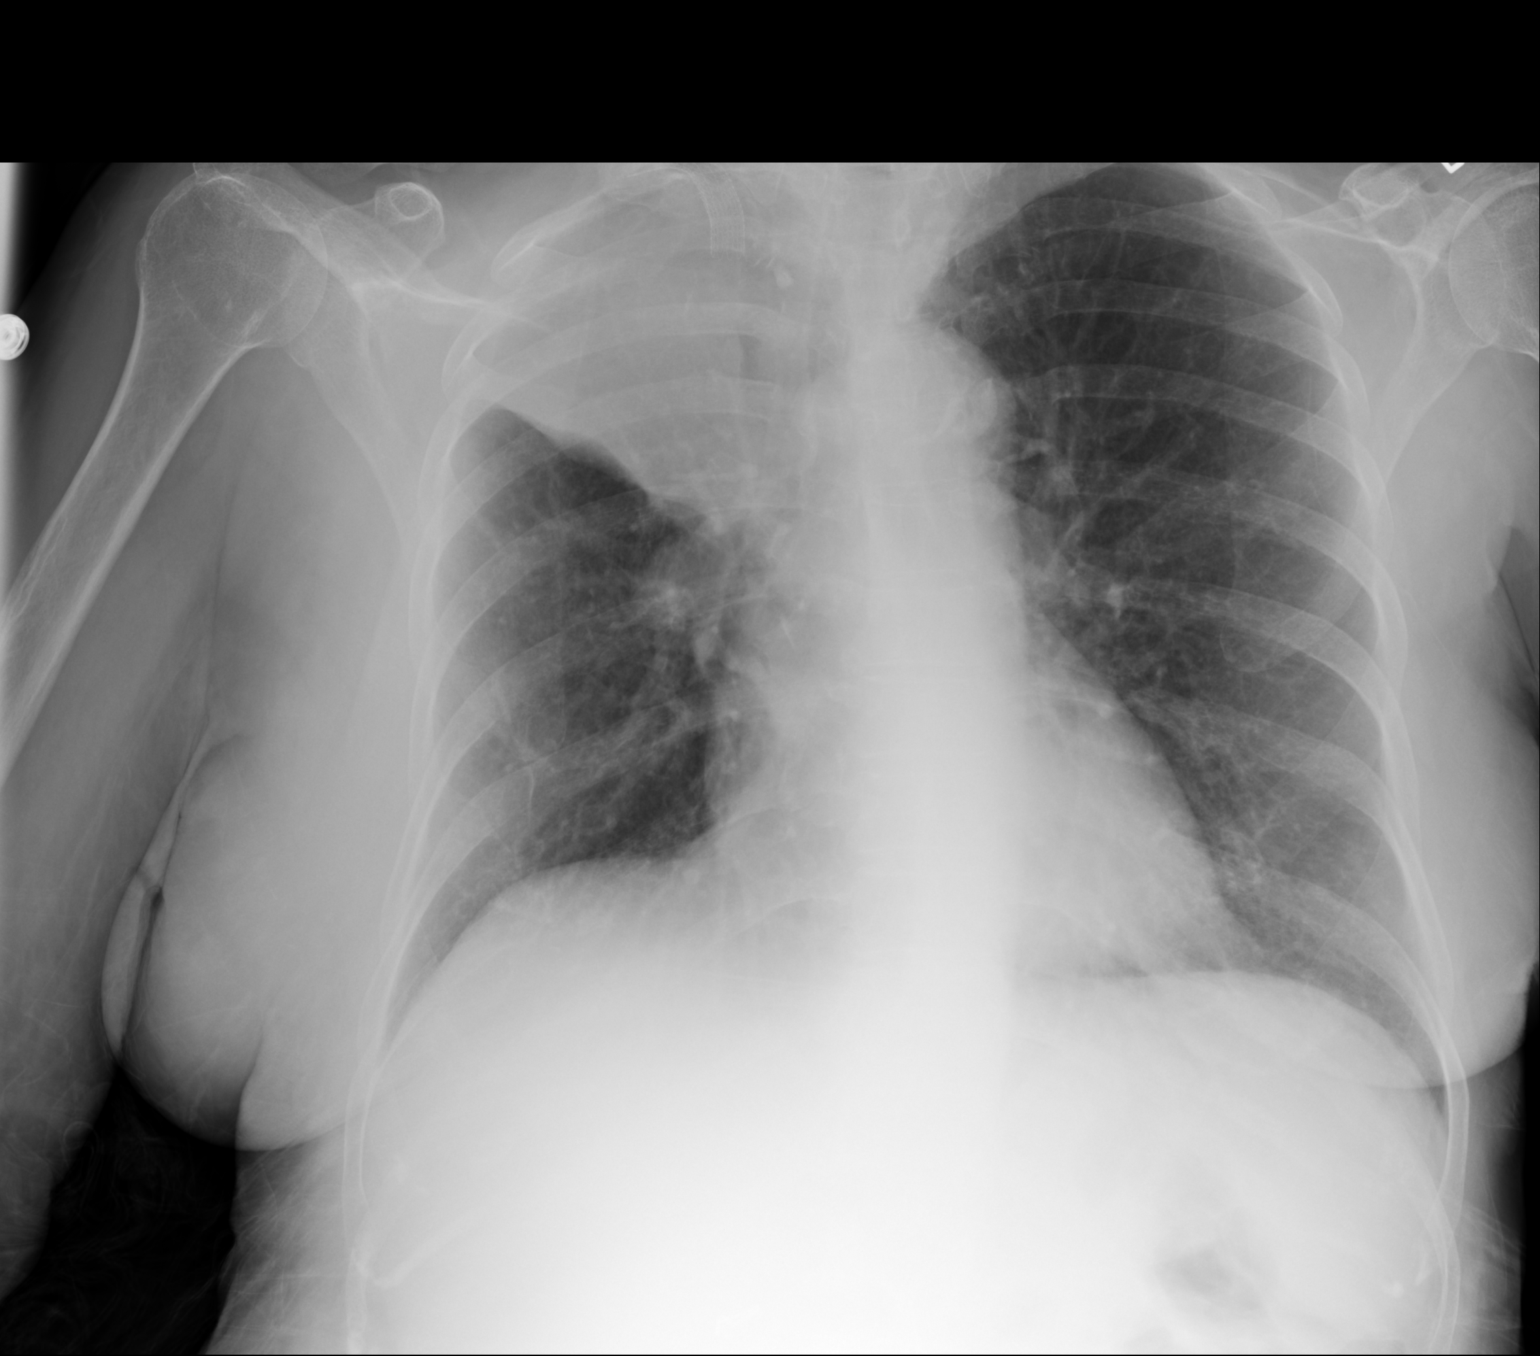

[1 of 1 positions shown; findings below may reference images not displayed]

FINDINGS: There has been increased density of the right upper lobe
consolidative density. No new focal regions of consolidation or new focal
infiltrates are appreciated. There does appear to be a component of volume
loss within the right hemithorax in the area of consolidation and a
component of the consolidation in the right lobe likely represents
atelectatic and/or collapse of the lung. The cardiac silhouette and
visualized bony skeleton is unremarkable.
IMPRESSION: Increased density of the right upper lobe consolidation as
described above. Continued surveillance evaluation is recommended.

## 2013-07-12 IMAGING — CT CT CHEST W/ CM
1 series · 15 of 33 positions shown, 19 images · non-contrast
Comparison: none

REASON FOR EXAM: fever, hypercoagulable state
COMMENTS:

[Series 4: soft tissue · axial · 0.69mm/px · z∈[-302,-48]mm · 15 of 101 slices shown, 19 images]
[im 8/101  mediastinal]
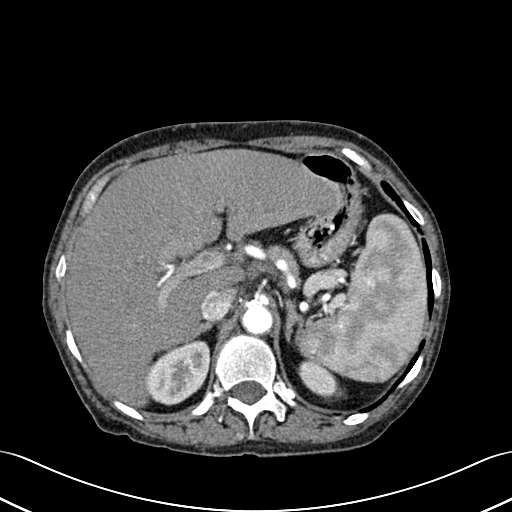
[im 8/101  lung]
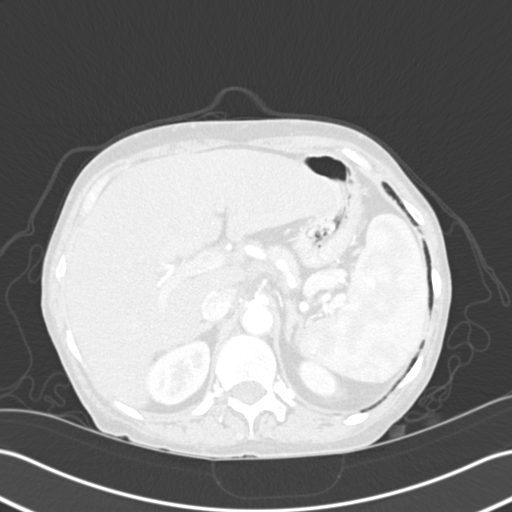
[im 15/101  lung]
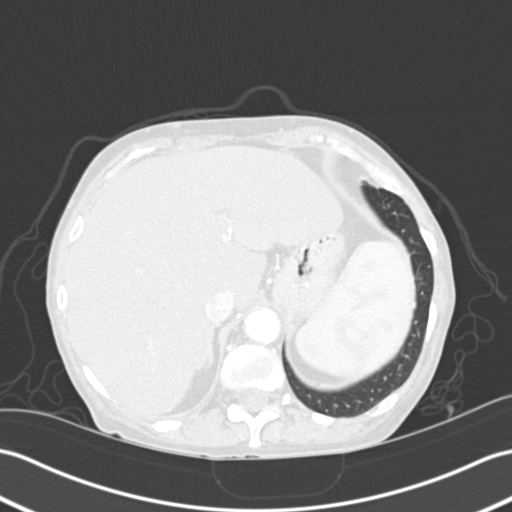
[im 21/101  lung]
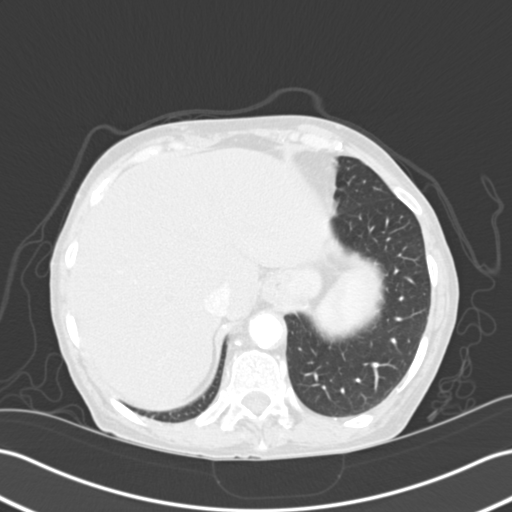
[im 26/101  lung]
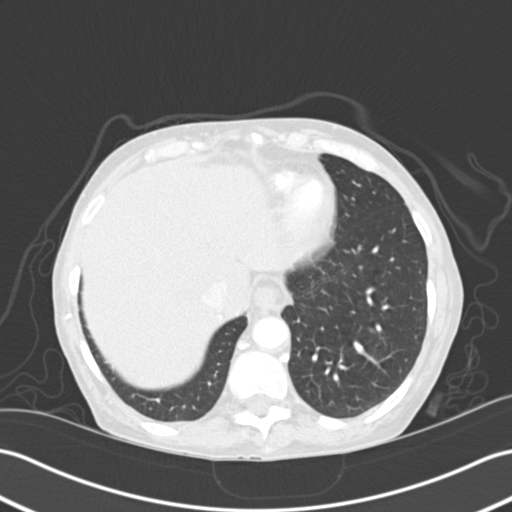
[im 34/101  mediastinal]
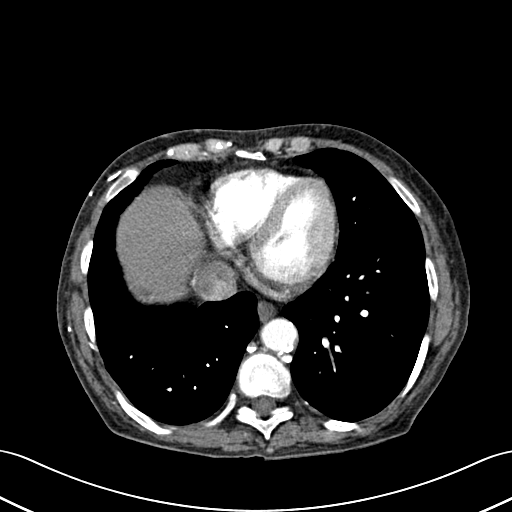
[im 34/101  lung]
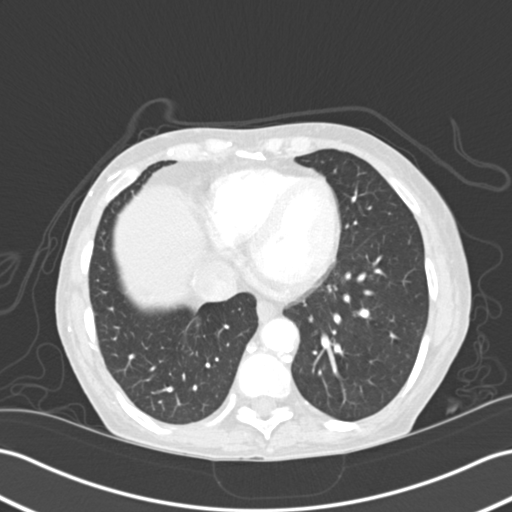
[im 41/101  lung]
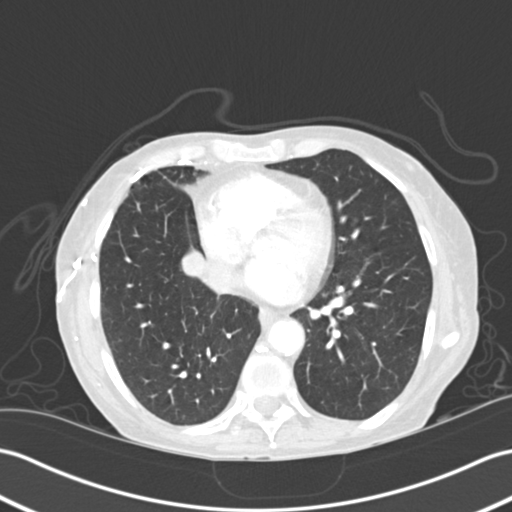
[im 48/101  lung]
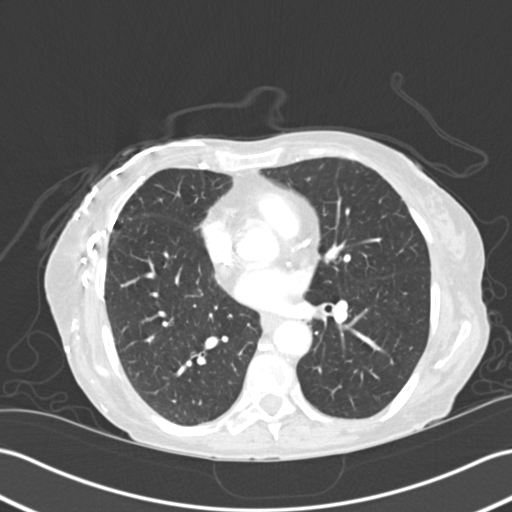
[im 52/101  lung]
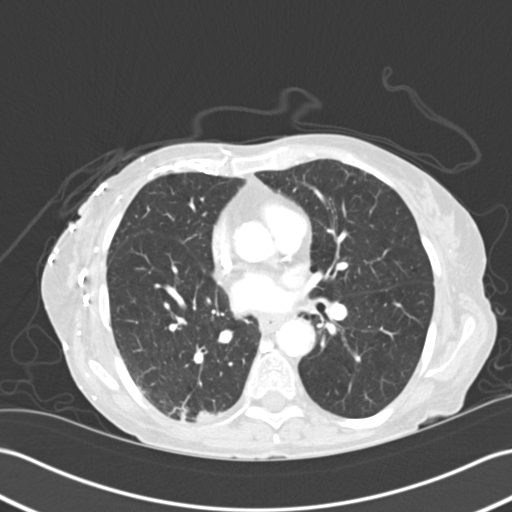
[im 56/101  mediastinal]
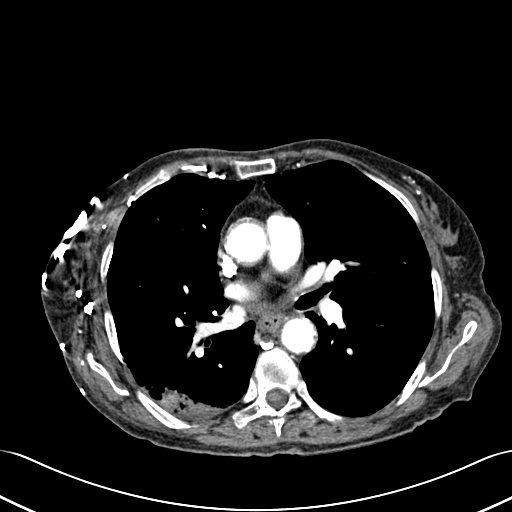
[im 56/101  lung]
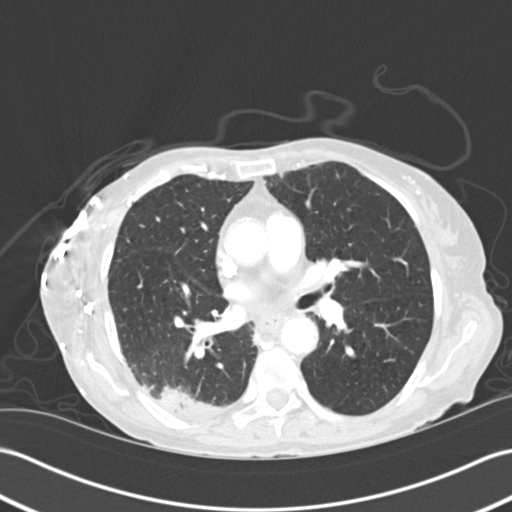
[im 61/101  lung]
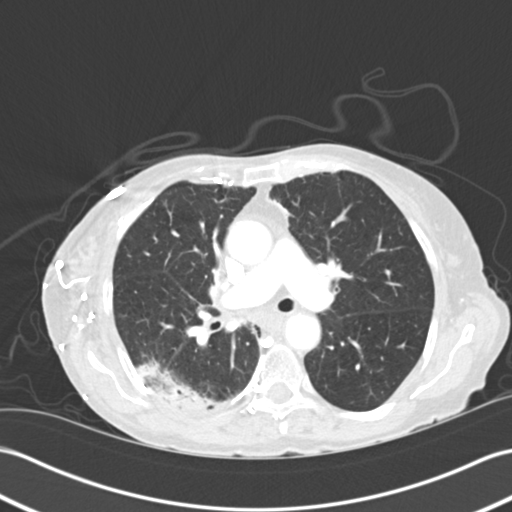
[im 67/101  lung]
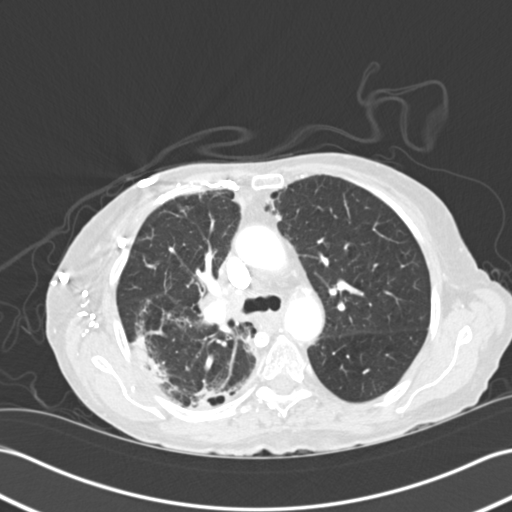
[im 75/101  lung]
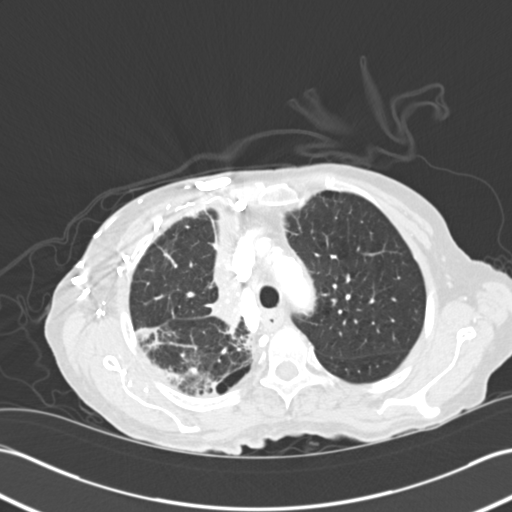
[im 81/101  mediastinal]
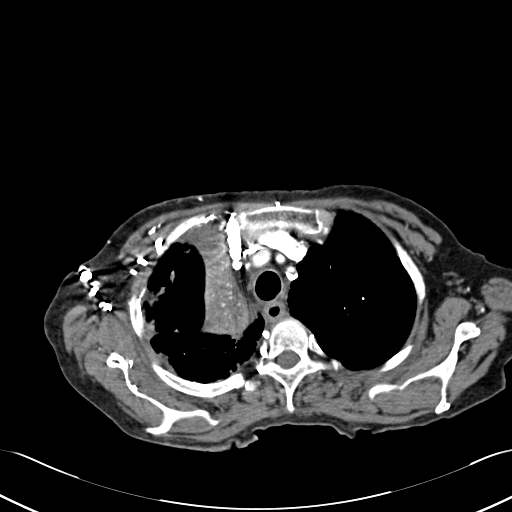
[im 81/101  lung]
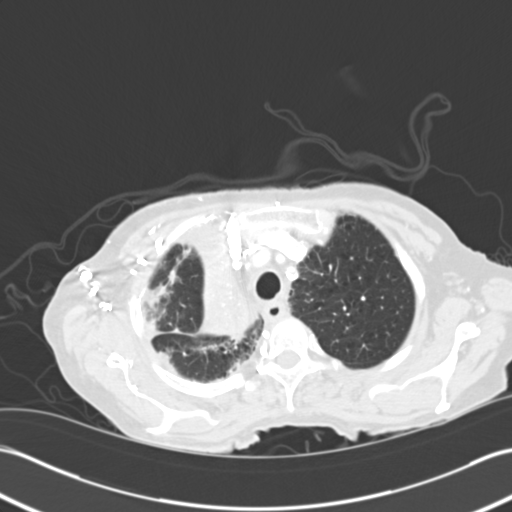
[im 86/101  lung]
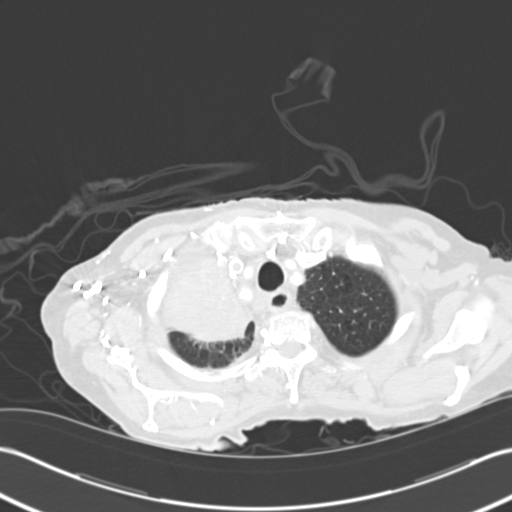
[im 93/101  lung]
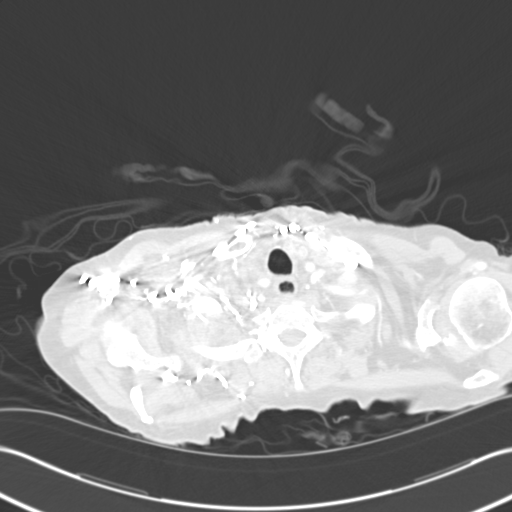

[15 of 33 positions shown; findings below may reference images not displayed]

PROCEDURE:     CT  - CT CHEST (FOR PE) W  - April 06, 2012  [DATE]

RESULT:     Chest CT with contrast is performed utilizing 75 mL of
Osovue-08I iodinated intravenous contrast with images reconstructed at 3 mm
slice thickness in the axial plane compared to previous examination from 28 December, 2011.

There is persistent area of consolidation in the right upper lobe anteriorly
extending toward the right hilum and more anteromedially as the goes
inferior. There is some increased density in the superior segment of the
right lower lobe which may represent infiltrate. Post radiation changes are
not excluded. Post radiation pneumonitis may be present. Some bullous region
is seen adjacent to this in the paraspinal superior segment of the right
lower lobe. The left lung appears relatively clear. There is some nodularity
adjacent to the inferior major fissure on the right unchanged compared to
the previous study measured on image 56 at approximately 5.6 mm diameter.
There is no definite CT evidence of a pulmonary embolic filling defect. No
pleural or pericardial effusion is evident. There is elevation of the right
hemidiaphragm. The spleen appears to be mildly prominent at 11.5 cm
anterior-posterior on image 95. The included upper abdominal structures
appear to be otherwise grossly unremarkable. The thoracic aorta is normal in
caliber without dissection.
IMPRESSION: 1. Persistent area of presumed atelectasis that appears to be smaller
compared to the previous exam. New increased density in the inferior right
upper lobe and superior segment of the right lower lobe. Infectious versus
post radiation pneumonitis changes are most likely differential
considerations. There is underlying emphysema. There is no effusion or
pneumothorax. Stable nodularity is seen adjacent to the major fissure on the
right inferiorly. Borderline splenomegaly.

[REDACTED]

## 2013-07-13 IMAGING — CR DG CHEST 1V PORT
1 series · 1 of 1 positions shown · non-contrast
Comparison: none

REASON FOR EXAM: check central line placement
COMMENTS:

[ap]
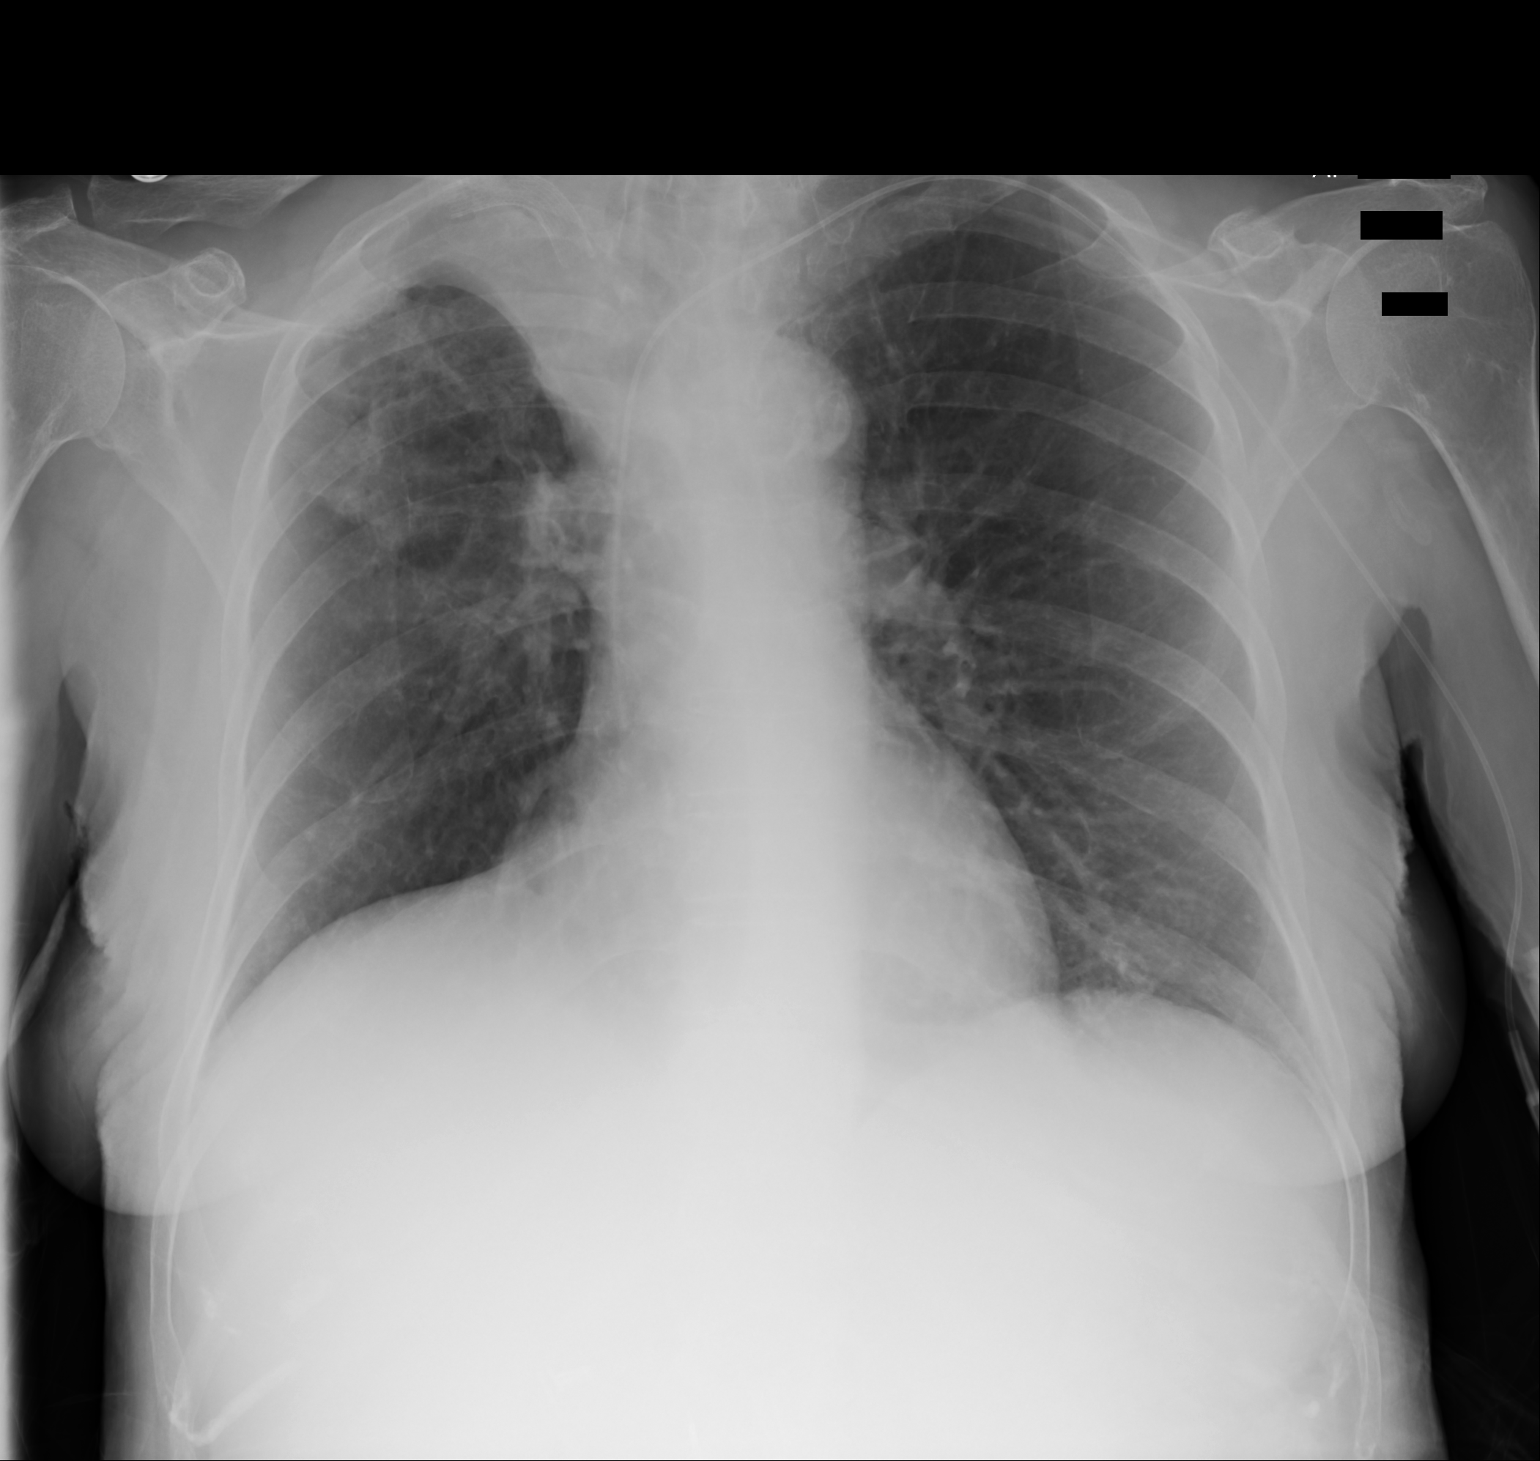

[1 of 1 positions shown; findings below may reference images not displayed]

PROCEDURE:     DXR - DXR PORTABLE CHEST SINGLE VIEW  - April 07, 2012 [DATE]

RESULT:     Comparison is made to the study 06 April, 2012.

There is apical density on the right extending to the hilar region. This is
unchanged. A peripherally inserted central catheter is present from the left
side with the tip in the junction of the superior vena cava with the right
atrium region. The heart is normal. Atherosclerotic calcification is
present. Patchy density in the right midlung could represent some minimal
infiltrate and is unchanged.
IMPRESSION: 1. Stable densities in the right especially in the right lung apex and upper
lobe medial to paramediastinal region. Patchy densities in the right midlung
are unchanged.

[REDACTED]

## 2013-07-25 IMAGING — CR DG CHEST 2V
1 series · 2 of 2 positions shown · non-contrast
Comparison: none

REASON FOR EXAM: fever and lung ca
COMMENTS:

PROCEDURE:     DXR - DXR CHEST PA (OR AP) AND LATERAL  - April 19, 2012  [DATE]
RESULT:     Comparison: 04/07/2012

[Series 3: w chest lat · 0.14mm/px · 2 of 2 slices shown]
[im 1/2]
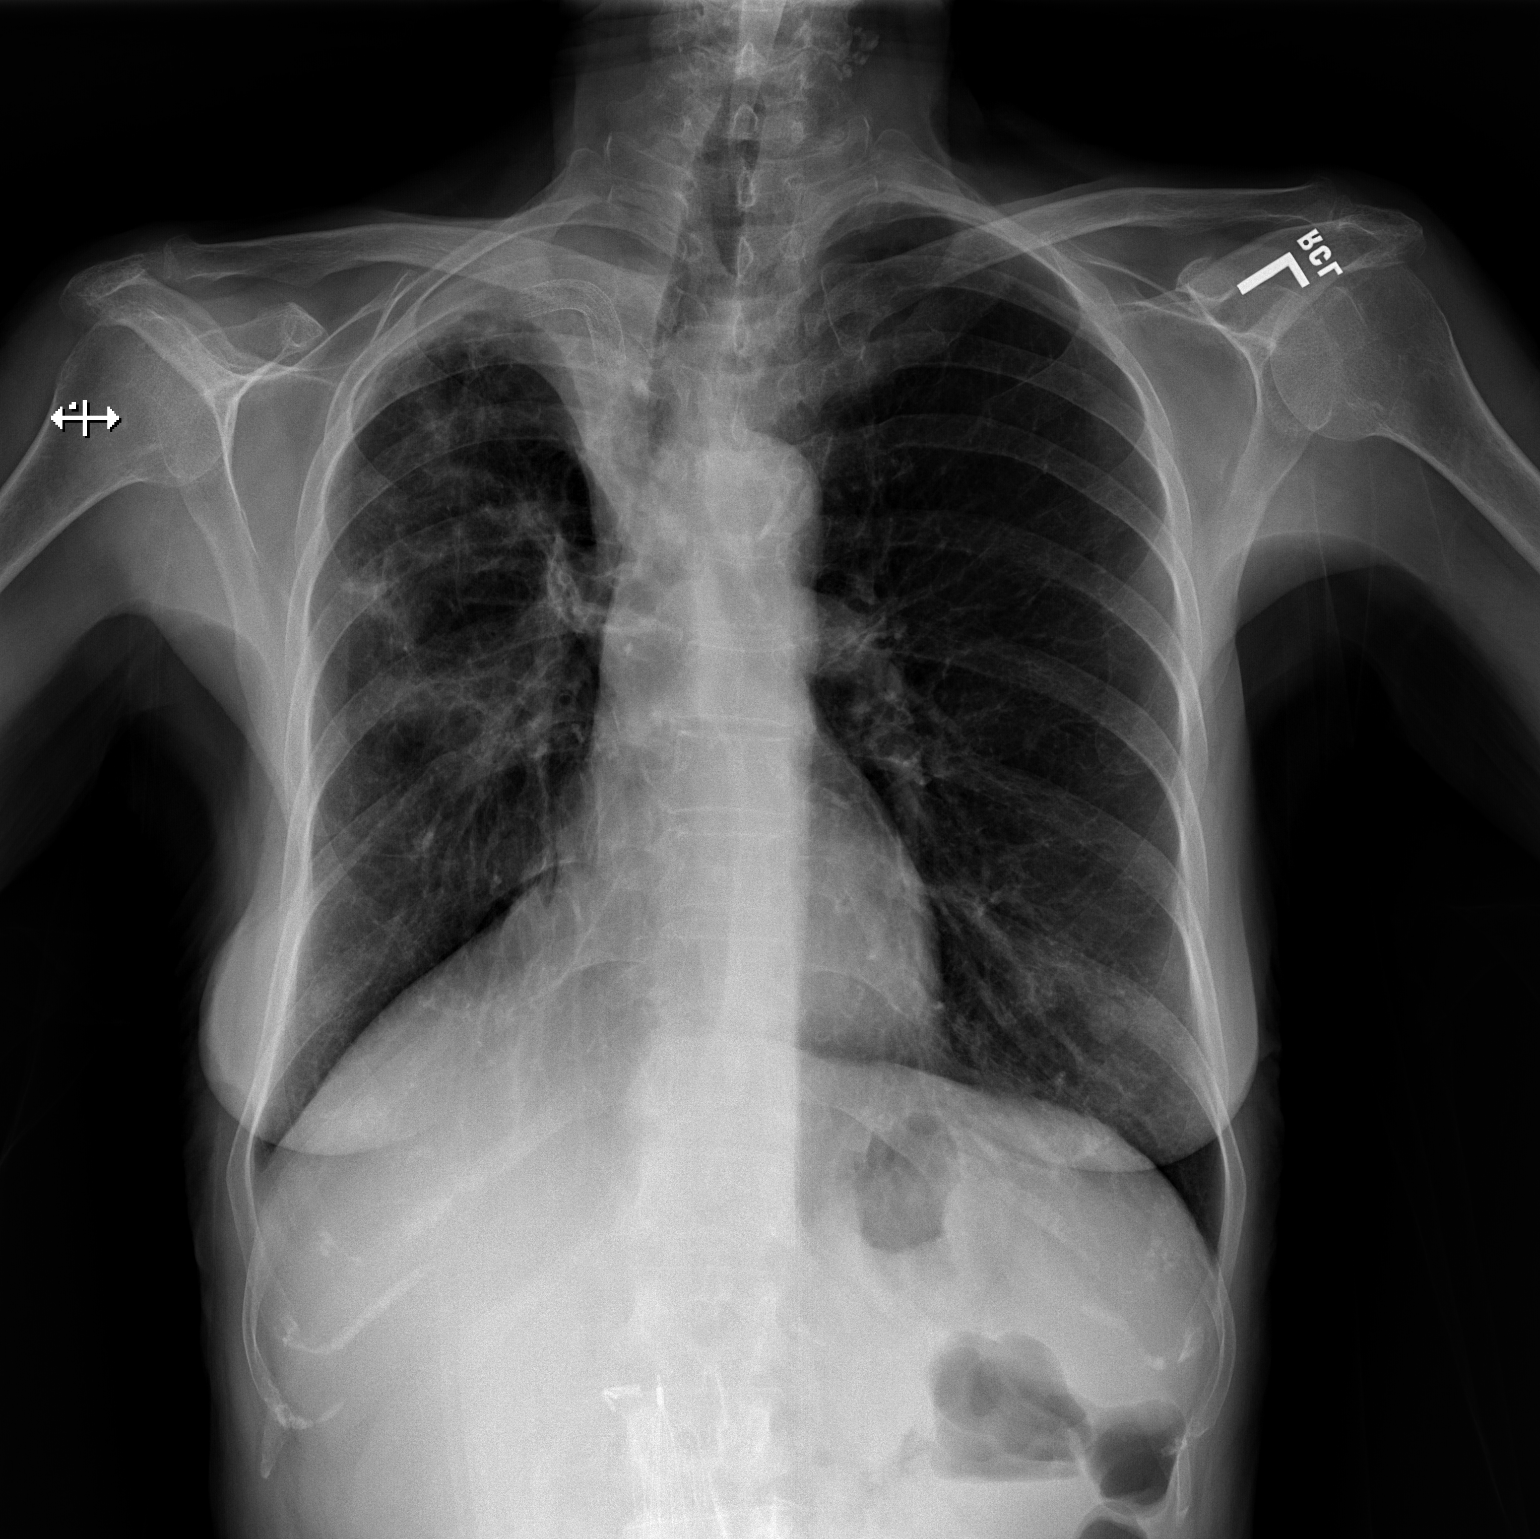
[im 2/2]
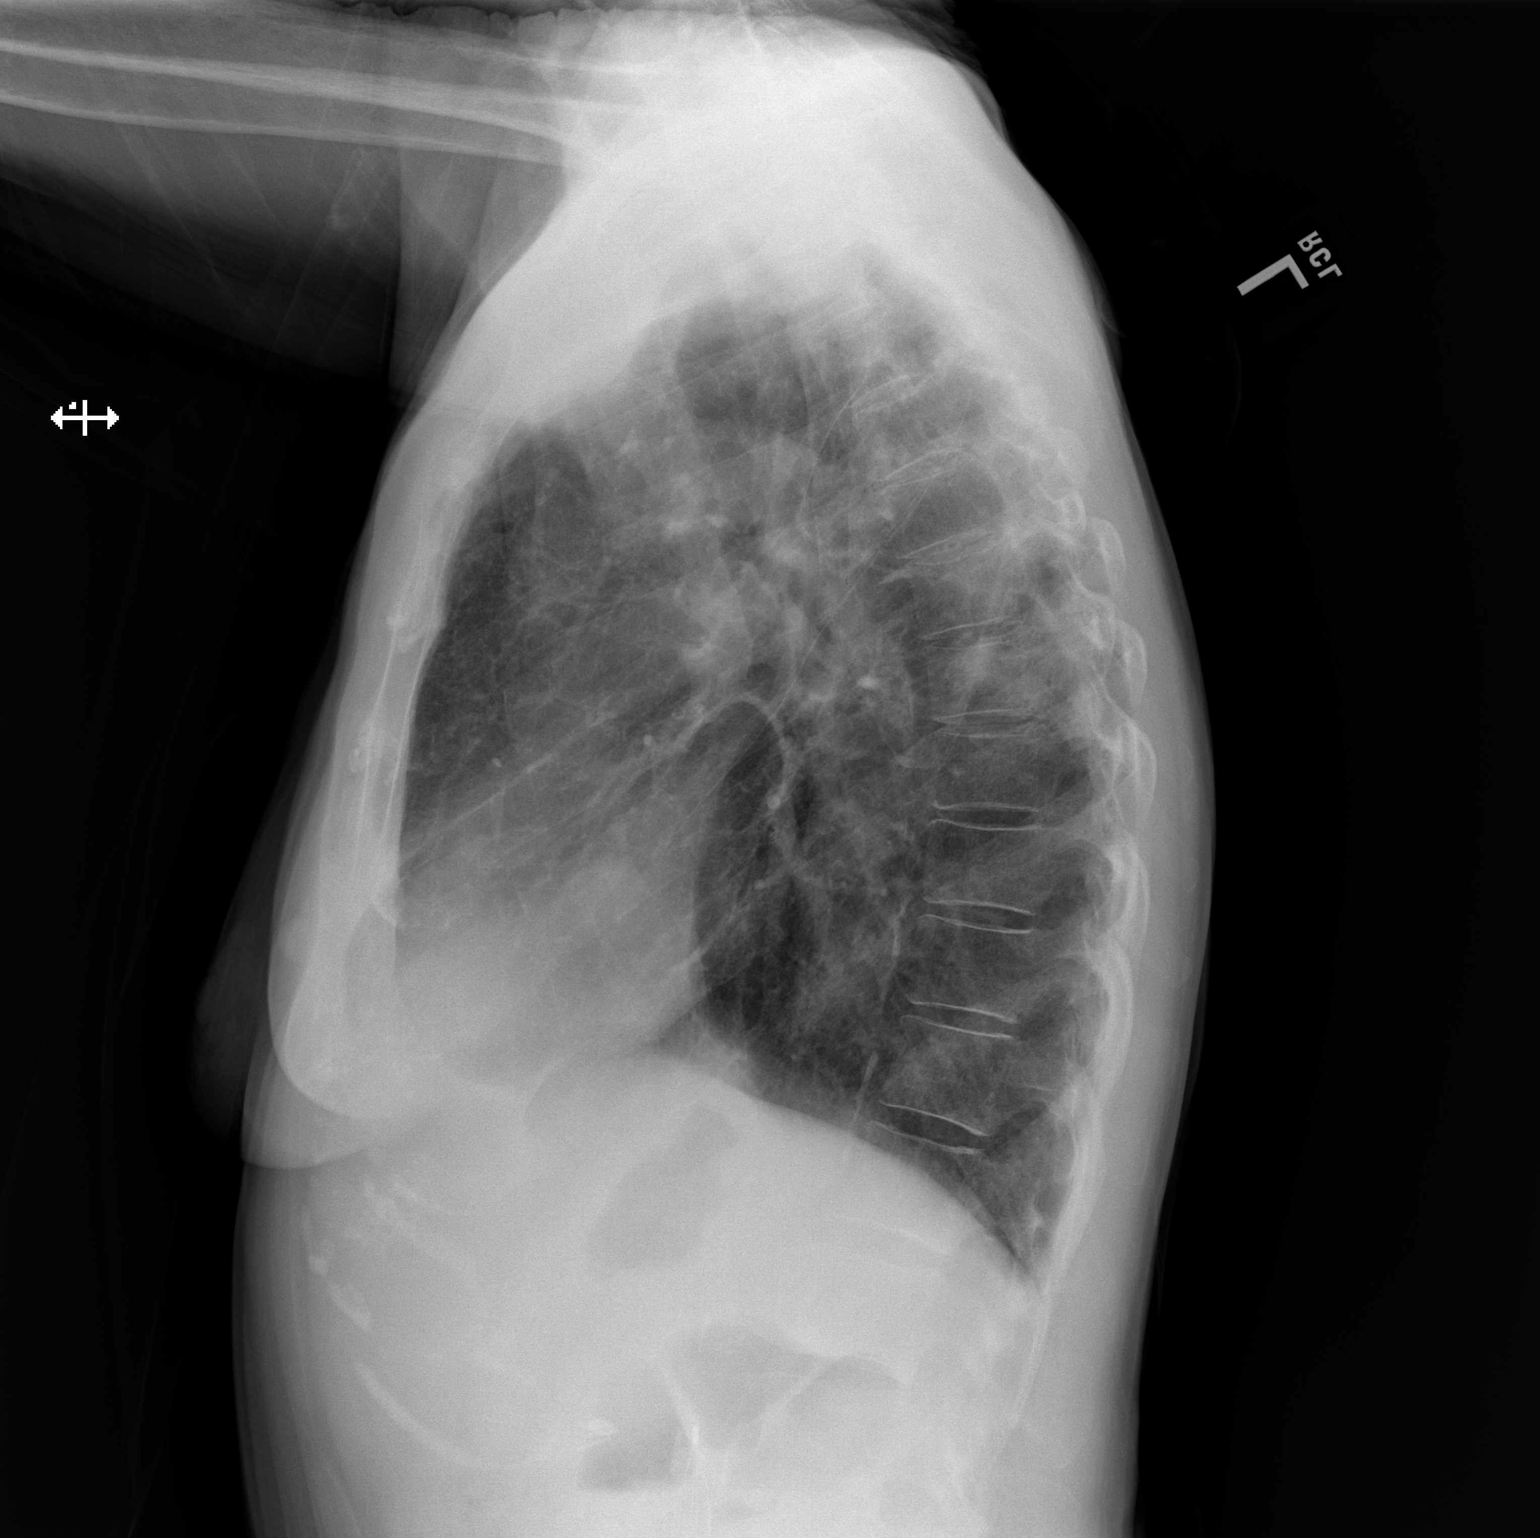

[2 of 2 positions shown; findings below may reference images not displayed]

FINDINGS: The heart and mediastinum are stable. Left PICC catheter has been removed.
Triangular opacity at the right lung apex is likely secondary to collapse of
the right upper lobe, similar to prior. Mild reticular opacities in the
right mid and upper lung are similar to prior and related to scarring. Small
nodular density overlying left lower lung is like a secondary to a nipple
shadow.
IMPRESSION: Persistent collapse of the right upper lobe. Mild reticular opacities in the
right lower lung are similar to prior and may be secondary to scarring.
Infection would be difficult to exclude. Continued followup radiographs are
suggested.

[REDACTED]

## 2014-09-23 NOTE — Discharge Summary (Signed)
PATIENT NAME:  Ashley Ball, Ashley Ball MR#:  [id number] DATE OF BIRTH:  Jan 11, 1946  DATE OF ADMISSION:  04/03/2012 DATE OF DISCHARGE:  04/11/2012  FINAL DIAGNOSES:  1. Fever and leukocytosis. Fever possibly due to pneumonitis versus tumor fever.  2. Underlying lung cancer status post radiation treatment.  3. Rash attributed to drug rash.  4. 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   HISTORY AND PHYSICAL:  Dictated on admission by Dr. Sherrlyn HockPandit.   HOSPITAL COURSE: As initially noted, the patient was given IV fluids, electrolytes were checked, and the patient was placed on Cipro IV and on Zithromax p.o. initially empirically. X-ray was checked. Later I followed the patient on my service and there was also Ball consultation from Dr. Leavy CellaBlocker with infectious disease. Antibiotics were adjusted empirically, first with Ball dose of oral clindamycin and then intravenous was changed to IV aztreonam as the patient developed some increased rash possibly attributed to Cipro. Later in the hospital course Ball PICC line was placed. The patient continued on aztreonam IV and completed Zithromax p.o., later changed to p.o. doxycycline. The patient's pruritus continued. Fever curve was improving but leukocytosis persisted. The patient had potassium supplemented and with vital signs stable, clinical exam unchanged, and no evidence of active infection was discharged home on oral antibiotics.   On 11/06 lungs were clear, neuro was grossly nonfocal. There were some cognitive and memory lapses which were at baseline. The PICC line was discontinued. The site was okay. There is some diffuse maculopapular rash.   DISCHARGE MEDICATIONS:  1. Xanax 0.25 mg p.o. every eight hours p.r.n. 2. Albuterol inhaler 2 puffs every six hours p.r.n. for cough or wheezing or shortness of breath.  3. Potassium 20 mEq p.o. daily. 4. Arixtra 5 mg subcutaneously daily at 2:00 p.m.  5. Carafate 1 gram p.o.  t.i.d. 6. Oxycodone 5 mg p.o. b.i.d. p.r.n. for pain.  7. Magnesium oxide 400 mg p.o. b.i.d.  8. Doxycycline 100 mg p.o. b.i.d. for an additional eight days.  9. 1% cortisone ointment topically.   FOLLOWUP: The patient has followup in [facility] hours [facility].   ____________________________ Knute Neuobert G. Lorre NickGittin, MD rgg:bjt D: 04/23/2012 10:19:43 ET T: 04/23/2012 12:10:15 ET JOB#: [id number]  cc: Knute Neuobert G. Lorre NickGittin, MD, <Dictator> Marin RobertsOBERT G Sherian Valenza MD ELECTRONICALLY SIGNED 05/22/2012 21:56

## 2014-09-23 NOTE — Consult Note (Signed)
Brief Consult Note: Diagnosis: Presumed stage IV Lung Cancer.   Patient was seen by consultant.   Consult note dictated.   Comments: Not a surgical candidate.  Would check MRI of brain before starting thrombolytics.  Electronic Signatures: Jasmine Decemberaks, Donisha Hoch E (MD)  (Signed 26-Jul-13 08:09)  Authored: Brief Consult Note   Last Updated: 26-Jul-13 08:09 by Jasmine Decemberaks, Jessel Gettinger E (MD)

## 2014-09-23 NOTE — Consult Note (Signed)
Chief Complaint:   Subjective/Chief Complaint NO ACUTE COMPLAINTS  SLEEPY FROM MEDICATION   VITAL SIGNS/ANCILLARY NOTES: **Vital Signs.:   29-Jul-13 17:23   Vital Signs Type Routine   Temperature Temperature (F) 97.4   Celsius 36.3   Temperature Source Oral   Pulse Pulse 101   Respirations Respirations 18   Systolic BP Systolic BP 133   Diastolic BP (mmHg) Diastolic BP (mmHg) 82   Mean BP 99   Pulse Ox % Pulse Ox % 97   Pulse Ox Activity Level  At rest   Oxygen Delivery Room Air/ 21 %   Brief Assessment:   Respiratory no use of accessory muscles    Gastrointestinal details normal Soft  Nontender    Additional Physical Exam LETHARGIC, COOPERATIVE   Assessment/Plan:  Assessment/Plan:   Assessment 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 not a candidate for surgical bx. bronchoscopy has been done, no endobronchial lesions, i have not seen final report but looks non diagnostic. Patient will need either u/s guidded bx of supraclavicular node or CT guided bx of  sacrum, or if negative ultimately of lung. Will plan with radiology, and also discuss with vascular surgery, RE when anticoagulation can be interrupted. All furthur oncology w/u can be done as out patient   Electronic Signatures: Marin RobertsGittin, Robert G (MD)  (Signed 29-Jul-13 19:02)  Authored: Chief Complaint, VITAL SIGNS/ANCILLARY NOTES, Brief Assessment, Assessment/Plan   Last Updated: 29-Jul-13 19:02 by Marin RobertsGittin, Robert G (MD)

## 2014-09-23 NOTE — Consult Note (Signed)
Brief Consult Note: Diagnosis: rul mass likely primary lung cancer, thrombosis secondary to obstruction, leukocytosis likely reactive.   Patient was seen by consultant.   Consult note dictated.   Recommend further assessment or treatment.   Discussed with Attending MD.   Comments: Also discussed with DR DEW AND DR OAKS AND DR BROWNE.   NOTE POSSIBILITY OF TUMOR THROMBUS CONSIDERED.  PET SCAN , IF CONFIRMED, SHOWS STAGE 4 DISEASE PROBABLE SUPRACLAVICUULAR NODE POSITIVE, AND PROBABLE METASTASIS TO SACRUM. THERE IS A PALPABLE SUBRACLAVICULAR NODE.   PLAN..THORACIC SURGERY DR OAKS TO SEE, CONSIDER BX SUBPRACLAVICULAR NODE FOR HISTOLOGY AND MOLECULAR STUDIES, AND VASCULAR SURGERY TO ADVISE RE POSSIBLE VENOGRAM TO R/O TUMOR THROMBUS. IF UNABLE TO CONFIRM STAGE 4 DISEASE COULD REIMAGE OR BX SACRUM. WILL ALSO CONSULT RADIATION ONCOLOGY, AS PLAN FOR CHEMOTX AND XRT, AND PORT. WILL CHECK MOLECULAR MARKERS TO IDENTIFY IF A CANDIDATE FOR TARGETED THERAPY.Marland Kitchen.  Electronic Signatures: Ashley Ball, Ashley Ball (MD)  (Signed 25-Jul-13 19:15)  Authored: Brief Consult Note   Last Updated: 25-Jul-13 19:15 by Ashley Ball, Sharisa Toves Ball (MD)

## 2014-09-23 NOTE — H&P (Signed)
PATIENT NAME:  Ashley Ball, Ashley A MR#:  [id number] DATE OF BIRTH:  Sep 14, 1945  DATE OF ADMISSION:  12/28/2011  PRIMARY CARE PHYSICIAN: [facility]  CHIEF COMPLAINT: Swollen arm and neck.   HISTORY OF PRESENT ILLNESS: This is a 69 year old female who has been having pain in the shoulder and neck over the past week. The swelling started a little bit yesterday but really swollen today down the right arm. She has lost 20 pounds over the past few months. In the Emergency Room she was found to have an extensive occlusive thrombus in the right upper extremity and right internal jugular vein, possible soft tissue mass versus adenopathy in the right neck. She is also found to have an elevated white count of 26.4. Hospitalist services were contacted for further evaluation.   PAST MEDICAL HISTORY:  1. Asthma. 2. Alcoholic cirrhosis. 3. Psoriasis.   PAST SURGICAL HISTORY: Cholecystectomy.   ALLERGIES: Ibuprofen and penicillin.   MEDICATIONS:  1. Albuterol inhaler p.r.n.  2. She has been on Stelara injections by the dermatologist for psoriasis.   SOCIAL HISTORY: Smokes half pack per day. Positive for alcohol but none for the past two days; does drink a 40 ounce beer or wine. No shaking if does not drink. She did do drug use about 14 years ago. 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   FAMILY HISTORY: Father with heart disease, passed away from that. Mother died at 5245 of cirrhosis.   REVIEW OF SYSTEMS: CONSTITUTIONAL: Positive for night sweats. No fever or chills. Positive for weight loss, 20 pounds. No weakness or fatigue. EYES: She does wear reading glasses. EARS, NOSE, MOUTH, AND THROAT: No hearing loss. No sore throat. No difficulty swallowing. CARDIOVASCULAR: No chest pain. No palpitations. RESPIRATORY: No shortness of breath. No coughing. No sputum. GASTROINTESTINAL: Positive for nausea. No vomiting. Occasional diarrhea or constipation. Occasional black stools.  No bright red blood per rectum. No melena. GENITOURINARY: No burning on urination. No hematuria. MUSCULOSKELETAL: Positive for right arm pain. INTEGUMENTARY: Positive for itching. NEUROLOGIC: No fainting or blackouts. PSYCHIATRIC: Sometimes anxiety. ENDOCRINE: No thyroid problems. HEMATOLOGIC/LYMPHATIC: No anemia. No easy bruising or bleeding.   PHYSICAL EXAMINATION:  VITAL SIGNS: Temperature 99.9, pulse 123, respirations 24, blood pressure 105/77, pulse oximetry 94%.   GENERAL: No respiratory distress.   EYES: Conjunctivae and lids normal. Pupils equal, round, and reactive to light. Extraocular muscles intact. No nystagmus.   EARS, NOSE, MOUTH, AND THROAT: Tympanic membranes no erythema. Nasal mucosa no erythema. Throat no erythema. No exudate seen. Lips and gums no lesions.   NECK: Tender to palpation over the right neck. Unclear if I am feeling soft tissue swelling seen on the ultrasound. No thyromegaly. No thyroid nodules palpated.   RESPIRATORY: Coarse breath sounds bilaterally. No rhonchi, rales, or wheeze heard.   CARDIOVASCULAR: S1 and S2 normal. No gallops, rubs, or murmurs heard. Carotid upstroke 2+ bilaterally. No bruits.   EXTREMITIES: Dorsalis pedis pulses 2+ bilaterally. No edema of the lower extremity.   ABDOMEN: Soft, nontender. No organomegaly/splenomegaly. Normoactive bowel sounds. No masses felt.   LYMPHATIC: No lymph nodes in the neck.   MUSCULOSKELETAL: No clubbing. No edema. No cyanosis.   SKIN: No ulcers seen.   NEUROLOGIC: Cranial nerves II through XII grossly intact. Deep tendon reflexes 2+ bilateral lower extremities.   PSYCHIATRIC: Patient is oriented to person, place, and time.   LABORATORY, DIAGNOSTIC, AND RADIOLOGICAL DATA: Urinalysis: 1+ leukocyte esterase, 1+ blood. White count  26.4, hemoglobin and hematocrit 11.3 and 35.5, platelet count 258, glucose 113, BUN 7, creatinine 0.51, sodium 133, potassium 3.0, chloride 96, CO2 28, calcium 8.2. Liver function  tests: AST slightly elevated at 45, albumin low at 2, INR 1.2, PT 15.5, PTT 30.5. Ultrasound of the right extremity showed extensive thrombus right upper extremity and right internal jugular vein, possible soft tissue mass versus adenopathy in the right neck. EKG ordered by me.   ASSESSMENT AND PLAN:  1. Extensive deep vein thrombosis. Will send off a hypercoagulable work-up. Start a heparin drip. Coumadin starting tomorrow. Consult vascular for extensive deep vein thrombosis. Will get a CT scan of the chest and neck to rule out malignancy. Will also send off an alpha-fetoprotein. Benefits and risks of anticoagulation discussed with the patient and daughter. Must stop alcohol. Must watch closely for bleeding. Will guaiac stools. Careful  with cirrhosis and anticoagulation.  2. Systemic inflammatory response syndrome, tachycardia, leukocytosis, possible urinary tract infection, possible cellulitis on the right upper extremity with redness. Levaquin will be given IV.  3. Alcoholic cirrhosis. Not on any medications for this.  4. Tobacco abuse. Smoking cessation counseling done, three minutes by me. Nicotine patch applied.  5. Alcohol abuse. No signs of withdrawal at this time, put on oral CIWA protocol.  6. Hypokalemia and hyponatremia. Will give IV potassium replacement. Alcohol could be contributing to the electrolyte abnormalities.  7. Malnutrition with albumin of 2.0. Most likely this is related to the alcohol.       TIME SPENT ON ADMISSION: 55 minutes.   ____________________________ Herschell Dimes. Renae Gloss, MD rjw:cms D: 12/28/2011 22:53:10 ET T: 12/29/2011 07:19:18 ET JOB#: [id number]  cc: Herschell Dimes. Renae Gloss, MD, <Dictator> Angus Palms, MD Salley Scarlet MD ELECTRONICALLY SIGNED 01/02/2012 17:20

## 2014-09-23 NOTE — Consult Note (Signed)
PATIENT NAME:  Ashley Ball, Melinda A MR#:  [id number] DATE OF BIRTH:  January 12, 1946  DATE OF CONSULTATION:  12/30/2011  REFERRING PHYSICIAN:  Benita Gutterobert Gittin, MD CONSULTING PHYSICIAN:  Sheppard Plumberimothy E. Laurier Jasperson, MD  REASON FOR CONSULTATION: Evaluation of right upper lobe mass.   HISTORY OF PRESENT ILLNESS: I have personally seen and examined Ashley Ball. I have independently reviewed her chest CT.   This is a 69 year old woman who appears much older than her stated age who began experiencing some pain and swelling in her right upper extremity over the past week. She says  the first thing that she noticed was an enlarged lymph node at the angle of her right mandible. This was then followed within the next couple days with a feeling of fullness and tingling in her right upper extremity as well as some swelling. She presented to the Emergency Room where an ultrasound was performed revealing extensive occlusive thrombus in the right upper extremity and right internal jugular vein. She had a CT scan done which revealed an extensive tumor in the right upper lobe with possible involvement of the subclavian vein. A PET scan was done which revealed extensive disease scattered throughout the right upper lobe, the mediastinum, and also in the sacrum. This was all consistent with stage IV carcinoma of the lung. The patient states that she is scheduled to undergo bronchoscopy today and has been seen by Dr. Festus BarrenJason Dew for evaluation of her upper extremity deep vein thrombosis. She is currently on heparin therapy and there is some discussion regarding the addition of thrombolytics.   Ms. Ashley Ball is a lifelong smoker. She was also a heavy drinker in the past. She states that she only smokes about 1/2 pack of cigarettes a day now. She currently drinks one beer every other day.   PAST MEDICAL HISTORY: Her past medical history is significant for history of alcoholic cirrhosis and psoriasis. She has had a cholecystectomy in the past. She also  has a scar on her right lower extremity but she is unaware of what that might be from.   ALLERGIES: Her allergies include ibuprofen which she says causes an upset stomach.   SOCIAL HISTORY: She smokes half-pack of cigarettes a day. She admits to one alcoholic drink every other day. She previously worked in the [employer] industry. She is divorced. She has one  daughter.   FAMILY HISTORY: She has a father with heart disease and a mother with a history of cirrhosis. There is no history of any lung disease in her family.   REVIEW OF SYSTEMS: Complete review of systems was asked and was negative except for a 20 pound weight loss over the last several months, diminished appetite, and shortness of breath with minimal activities, and there was no history of hemoptysis. All other review of systems were negative.   PHYSICAL EXAMINATION:   GENERAL: A thin woman who appeared older than her stated age. She was edentulous.   HEENT: Head is normocephalic. The pupils were equal.   NECK: Supple without carotid bruits. There was a small less than 1 cm palpable submental node on the right. There was some fullness in the right side of her neck.   EXTREMITIES: Right upper extremity demonstrated some brawny edema. She had good pulses in her upper extremities. She had good motor strength in her upper extremities.   Her extremities were without clubbing, cyanosis, or edema. She had 2+ pulses throughout. In her right upper extremity, she did have some dilated veins over the  anterior chest wall.   LUNGS: Coarse breath sounds bilaterally.   HEART: Normal without any murmurs. There were no bruits heard.   ABDOMEN: Her abdomen was soft, nontender, and nondistended. There was no palpable hepatosplenomegaly.   NEUROLOGIC: Examination is grossly within normal limits.   MUSCULOSKELETAL: Examination is grossly within normal limits.   ASSESSMENT AND PLAN: I have independently reviewed the patient's CT scan. There is  extensive tumor within the right hemithorax and mediastinum. In addition, the PET scan suggests disease in the sacrum. At the present time, I believe that she has unresectable stage IV lung cancer. She is to have a bronchoscopy performed today by Dr. Welton Flakes which will give [place] an answer as to the cell type. I believe that I have nothing further to offer her with respect to surgical intervention. Dr. Wyn Quaker will evaluate the patient for thrombolytic therapy. That may be appropriate if her symptoms in her upper extremity do not resolve. However, before we would entertain administering thrombolytics, I would recommend that she have a MRI of her brain to rule out metastatic disease prior to initiation of intravenous thrombolytics.   Thank you very much for allowing me to participate in her care today.  ____________________________ Sheppard Plumber. Thelma Barge, MD teo:slb D: 12/30/2011 08:08:33 ET T: 12/30/2011 10:43:43 ET JOB#: [id number]  cc: Marcial Pacas E. Thelma Barge, MD, <Dictator> Jasmine December MD ELECTRONICALLY SIGNED 01/23/2012 10:54

## 2014-09-23 NOTE — Op Note (Signed)
PATIENT NAME:  Ashley Ball, Ashley Ball MR#:  [id number] DATE OF BIRTH:  [birth year]  DATE OF PROCEDURE:  01/02/2012  PREOPERATIVE DIAGNOSES:  1. Extensive right upper extremity deep vein thrombosis and central vein deep vein thrombosis.  2. Right-sided lung cancer.  3. Alcoholic liver disease.   POSTOPERATIVE DIAGNOSES:  1. Extensive right upper extremity deep vein thrombosis and central vein deep vein thrombosis.  2. Right-sided lung cancer.  3. Alcoholic liver disease.   PROCEDURES:    1. Ultrasound guidance for vascular access to right brachial vein.  2. Right extremity venogram and central venogram.  3. Catheter-directed thrombolysis with 6 mg of TPA delivered with the AngioJet AVX catheter.  4. Mechanical rheolytic thrombectomy to the right brachial vein, axillary vein, subclavian vein and innominate vein with the AngioJet AVX catheter.  5. Percutaneous transluminal angioplasty of right brachial vein, axillary vein, subclavian vein, innominate vein with 5 mm diameter angioplasty balloon.  6. Percutaneous transluminal angioplasty of right subclavian vein, innominate vein with 8 mm diameter angioplasty balloon.  7. Viabahn stent placement to right subclavian vein/innominate vein for greater than 50% residual stenosis and flow limitation after angioplasty, thrombectomy and thrombolysis.   SURGEON: Annice NeedyJason S. Colie Josten, M.D.   ANESTHESIA: Local with moderate conscious sedation.   ESTIMATED BLOOD LOSS: Minimal.   INDICATION FOR PROCEDURE: This is Ball 69 year old white female with unresectable lung cancer who has an extensive right upper extremity deep vein thrombosis which is highly symptomatic. She is brought in today for symptomatic relief. Her malignancy is likely terminal and unresectable stage IV malignancy. Risks and benefits including life-threatening hemorrhage from the thrombolysis were discussed. Informed consent was obtained.   DESCRIPTION OF PROCEDURE: The patient was brought to the vascular  interventional radiology suite. Right upper extremity was sterilely prepped and draped and Ball sterile surgical field was created. The right brachial vein was visualized and accessed under Ball moderate amount of difficulty with the micropuncture needle and micropuncture wire and sheath were then placed. I was able to get an 8 French sheath in and initially had planned to use the Trellis device to do catheter directed thrombolysis and mechanical thrombectomy. However, the Trellis device would not pass over the wire due to its high crossing profile and so this plan was abandoned and the AngioJet thrombectomy device was selected. With this, I was able to easily cross the area. I performed catheter directed thrombolysis with 4 mg of TPA instilled through the AVX catheter. This was allowed to dwell for 15 minutes. The veins encompassed included the right brachial vein, axillary vein, subclavian vein and innominate veins. After this was allowed to dwell, I turned my attention to the similar veins and performed mechanical rheolytic thrombectomy with the AngioJet AVX catheter. With this device the same veins were treated and we were able to return Ball flow channel at this point, although there was Ball flow channel, flow was still quite limited and there was significant residual thrombosis and stenosis in the subclavian vein and innominate vein particularly. This was in the area of the cancer and likely the reason for the thrombosis to begin with. We initially treated from the brachial vein up with Ball 5 mm balloon in an attempt to get the Trellis device in. Later, an 8 mm balloon was inflated from the axillary vein down to the innominate vein to the superior vena cava. After this, an 8 mm diameter x 5 cm in length Viabahn stent was selected for the residual stenosis and thrombosis and this was  postdilated with Ball 9 mm balloon. At this point, the flow was significantly better. There was still some thrombus present, but we at least had  Ball channel of in-line flow and I elected to terminate the procedure. The sheath was removed. The arm was wrapped and sterile dressing was placed. The patient tolerated the procedure well and was taken to the recovery room in stable condition.   ____________________________ Annice Needy, MD jsd:ap D: 01/02/2012 15:29:59 ET T: 01/02/2012 16:43:59 ET JOB#: [id number]  cc: Annice Needy, MD, <Dictator> Annice Needy MD ELECTRONICALLY SIGNED 01/05/2012 13:52

## 2014-09-23 NOTE — H&P (Signed)
PATIENT NAME:  Ashley Ball, Ashley Ball MR#:  [id number] DATE OF BIRTH:  02-Dec-1945  DATE OF ADMISSION:  04/03/2012  CHIEF COMPLAINT/REASON FOR ADMISSION: Known history of stage IV lung cancer on radiation, presented with progressive weakness, poor oral intake, clinical dehydration, fever and chills.   HISTORY OF PRESENT ILLNESS: The patient is a 69 year old female with past medical history significant for stage IV adenocarcinoma of the lung who is on radiation therapy, deep vein thrombosis (Coumadin failure and possibly allergic to Lovenox, currently on Arixtra 5 mg daily), cirrhosis, hepatitis C, chronic obstructive pulmonary disease, who came for radiation therapy today and stated that she was feeling extremely sick, she was having fever with shaking chills. She was feeling weak and dizzy on getting up and ambulating, and daughter present states that she is resting most of the time. She also had some diarrhea over the last weekend, but this is currently better. She denies any abdominal pain or blood in stools. No dysuria or hematuria. She has chronic cough, yellowish phlegm. She denies any pleuritic type chest pain. No loss of consciousness. No seizures. No new headaches or confusion.   PAST MEDICAL/SURGICAL HISTORY: As in history of present illness above.   FAMILY HISTORY: Noncontributory.   SOCIAL HISTORY: Positive for smoking and alcohol usage.   ALLERGIES: Penicillin and ibuprofen.   HOME MEDICATIONS:  1. Albuterol inhaler 2 puffs every 6 hours.  2. Alprazolam 0.25 mg every 8 hours p.r.n. for anxiety.  3. Arixtra 5 mg subcutaneous daily.  4. Oxycodone 5 mg every 6 hours p.r.n. for pain.  5. Sucralfate 1 gram p.o. t.i.d.   REVIEW OF SYSTEMS: CONSTITUTIONAL: The patient is feeling weak and tired. Fever and chills. No night sweats. HEENT: Dizziness on getting up. Otherwise, no headaches, epistaxis, ear or jaw pain. No sinus symptoms. CARDIAC: No angina, palpitation, orthopnea, or paroxysmal  nocturnal dyspnea. LUNGS: As in history of present illness. GASTROINTESTINAL: Currently no nausea, vomiting, or diarrhea. No blood in stools or melena. GU: No dysuria or hematuria. MUSCULOSKELETAL: Has chronic arthritis and back pain, no new bone pain. EXTREMITIES: No new swelling or pain. SKIN: No new rashes or pruritus. HEMATOLOGIC: No new bleeding symptoms. NEUROLOGIC: No new focal weakness, seizures, loss of consciousness. ENDOCRINE: No polyuria or polydipsia. Appetite is poor lately.   PHYSICAL EXAMINATION:  GENERAL: The patient is a moderately built, poorly nourished individual, weak and tired looking, resting in bed, otherwise awake and oriented to self, place, person, and time and converses appropriately. No icterus. No pallor.   VITAL SIGNS: Temperature 100.3, pulse 126, respiratory rate 20, blood pressure 144/76, 95% oxygen saturation.   HEENT: Normocephalic, atraumatic. Extraocular movements intact. Sclerae anicteric. No oral thrush. Mouth is dry. No mucositis.   NECK: Negative for lymphadenopathy.   CARDIOVASCULAR: S1, S2, tachycardic, regular rate and rhythm.   LUNGS: Bilateral diminished breath sounds overall. No crepitations or rhonchi.   ABDOMEN: Soft, nontender. No hepatosplenomegaly clinically. No guarding or rigidity. Bowel sounds are present.   EXTREMITIES: No major edema or cyanosis.   SKIN: Mild rash over back, no major bruising.   MUSCULOSKELETAL: No obvious joint deformity or swelling.   NEUROLOGIC: Limited examination. Cranial nerves seem intact. Alert and converses appropriately. Moves all extremities spontaneously.   LABORATORY, DIAGNOSTIC AND RADIOLOGICAL DATA: Labs from today are pending. CBC yesterday showed WBC 30,000 with ANC 27,000, hemoglobin 12.5, platelets 495.   IMPRESSION AND PLAN:  1. Known stage IV non-small cell lung cancer: On radiation therapy.  2. Fever, progressive cough: Possible  etiologies for infectious source could be bronchitis, pneumonia  or urinary tract infection. Blood pressure is stable at this time, but the patient is clinically very weak looking. Given this, we will admit to hospital for further evaluation and treatment. We will draw blood cultures, urine cultures, chest x-ray, PA and lateral, to look for pneumonia. The patient is allergic to penicillin (breaks out in hives) and also has questionable allergy to Levaquin, although it was later felt that this might be Lovenox rash by Dr. Lorre NickGittin. Given this, at this time we will start her on broad-spectrum antibiotic coverage with ciprofloxacin 400 mg IV every 12 hours and azithromycin 500 mg p.o. daily x5 to cover for atypical pneumonia. Further antibiotic therapy can be adjusted based upon culture reports and clinical response.  3. Poor oral intake, clinical dehydration: Will start on IV fluids 1/2 normal saline 125 mL/h, will check CBC and metabolic panel today and repeat in a.m.  4. Continue other home medications same as before including Xanax, Arixtra, oxycodone, sucralfate. We will add Xopenex nebulizer every 6 hours while awake.   CODE STATUS: Code status was discussed with the patient and daughter present; she does not want mechanical ventilation or resuscitation in case of cardiopulmonary failure/arrest. We will order DO NOT RESUSCITATE status accordingly.   The patient will be transferred to Dr. Paula ComptonGittin's service tomorrow for continued care. The patient and daughter were explained the above and are agreeable to this plan.   ____________________________ Maren ReamerSandeep R. Sherrlyn HockPandit, MD srp:cbb D: 04/03/2012 17:35:23 ET T: 04/03/2012 18:25:14 ET JOB#: [id number]  cc: Eli Adami R. Sherrlyn HockPandit, MD, <Dictator> Wille CelesteSANDEEP R Shemica Meath MD ELECTRONICALLY SIGNED 04/04/2012 10:53

## 2014-09-23 NOTE — Consult Note (Signed)
Chief Complaint:   Subjective/Chief Complaint NO ACUTE COMPLAINTS   VITAL SIGNS/ANCILLARY NOTES: **Vital Signs.:   30-Jul-13 16:19   Vital Signs Type Routine   Temperature Temperature (F) 98.3   Celsius 36.8   Temperature Source oral   Pulse Pulse 111   Respirations Respirations 18   Systolic BP Systolic BP 107   Diastolic BP (mmHg) Diastolic BP (mmHg) 70   Mean BP 82   Pulse Ox % Pulse Ox % 97   Oxygen Delivery Room Air/ 21 %   Brief Assessment:   Respiratory no use of accessory muscles    Gastrointestinal details normal Soft  Nontender    Additional Physical Exam COOPERATIVE, NEURO GROSSLY NON FOCAL   Lab Results: Routine UA:  30-Jul-13 05:29    Color (UA) Yellow   Clarity (UA) Clear   Glucose (UA) Negative   Bilirubin (UA) Negative   Ketones (UA) Negative   Specific Gravity (UA) 1.016   Blood (UA) 1+   pH (UA) 6.0   Protein (UA) Negative   Nitrite (UA) Negative   Leukocyte Esterase (UA) Negative (Result(s) reported on 03 Jan 2012 at 06:05AM.)   RBC (UA) 1 /HPF   WBC (UA) 2 /HPF   Bacteria (UA) NONE SEEN   Epithelial Cells (UA) 1 /HPF   Mucous (UA) PRESENT (Result(s) reported on 03 Jan 2012 at [facility].)  Routine Coag:  30-Jul-13 04:01    Activated PTT (APTT)  149.8 (A HCT value >55% may artifactually increase the APTT. In one study, the increase was an average of 19%. Reference: "Effect on Routine and Special Coagulation Testing Values of Citrate Anticoagulant Adjustment in Patients with High HCT Values." American Journal of Clinical Pathology 2006;126:400-405.)   Prothrombin  22.4   INR 1.9 (INR reference interval applies to patients on anticoagulant therapy. A single INR therapeutic range for coumarins is not optimal for all indications; however, the suggested range for most indications is 2.0 - 3.0. Exceptions to the INR Reference Range may include: Prosthetic heart valves, acute myocardial infarction, prevention of myocardial infarction, and  combinations of aspirin and anticoagulant. The need for a higher or lower target INR must be assessed individually. Reference: The Pharmacology and Management of the Vitamin K  antagonists: the seventh ACCP Conference on Antithrombotic and Thrombolytic Therapy. Chest.2004 Sept:126 (3suppl): L7870634. A HCT value >55% may artifactually increase the PT.  In one study,  the increase was an average of 25%. Reference:  "Effect on Routine and Special Coagulation Testing Values of Citrate Anticoagulant Adjustment in Patients with High HCT Values." American Journal of Clinical Pathology 2006;126:400-405.)    10:43    Activated PTT (APTT)  109.0 (A HCT value >55% may artifactually increase the APTT. In one study, the increase was an average of 19%. 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 not a candidate for surgical bx. bronchoscopy has been done, no endobronchial lesions, arm doingwell as per surgery, but temp spike last night so discharge delayed. DX POSITIVE NON SMALL CELL CANCER FROM TRANSBRONCHIAL FNA . UNLIKELY THAT FURTHUR MOLECULAR TESTING WILL BE AVAILABLE, WILL REVIEW WITH PATHOLOGY. MAY  NEED PERIPHERAL BLOOD VERISTRAT TESTING TO AID TX CHOICE. SACRUM IS POSITIVE ON PET, NOT CONFIRMED, A BX WOULD CONFIRM STAGE 4 BUT WOULD NOT YIELD GOOD MATERIAL FOR MOLECULAR TESTING. WILL CONSIDER REPEAT U/S OF NECK WHEN SWELLING IS DOWN, AND POSSIBLY IDENTIFY A SUBPRACLAVICULAR NODE FOR BX, IF ADDITIONAL MATERIAL NEEDED.Marland Kitchen.Marland Kitchen.FOR NOW CONVERT TO COUMADIN, OK TO DISCHARGE FROM  ONCOLOGY POINT OF VIEW, I CAN F/U  48 HRS FROM D/C IF NEEDED  TO RECHECK PROTIME. I WILL ALSO CONSULT RADIATION ONCOLOGY   Electronic Signatures: Marin RobertsGittin, Francesca Strome G (MD)  (Signed 30-Jul-13 18:34)  Authored: Chief Complaint, VITAL SIGNS/ANCILLARY NOTES, Brief Assessment, Lab Results, Assessment/Plan   Last Updated: 30-Jul-13 18:34 by Marin RobertsGittin, Maximino Cozzolino G (MD)

## 2014-09-23 NOTE — Op Note (Signed)
PATIENT NAME:  Ashley Ball, Ashley Ball MR#:  [id number] DATE OF BIRTH:  1946-02-20  DATE OF PROCEDURE:  12/30/2011  PROCEDURE: Fiberoptic bronchoscopy.   ATTENDING: Yevonne PaxSaadat Ball. Taylynn Easton, MD   PROCEDURE: After informed written consent was obtained from the patient, the patient was brought to the Bronchoscopy Suite and prepared in the usual manner. Ball total of 4 mg Versed and 100 mcg of Fentanyl were given before and during the procedure for sedation. After numbing the right nasal passage, scope was inserted down through the nostril down to the cords. Cords were anesthetized with 1% liquid lidocaine. Next, the scope was passed through the cords down into the carina which was visualized and found to be nice and sharp. The left lung was briefly examined without any endobronchial disease. Then, focus on the right lung was made. The right lung showed total occlusion at the takeoff of the right upper lobe. It appeared that this was mainly extrinsic compression. Also, there was some area of the airway that was inflamed and erythematous noted in the bronchus medius area. During this procedure, we proceeded to brush, wash, and do Ball needle biopsy of the interested area. Specimens were obtained and sent to the lab for evaluation.   The patient tolerated the procedure well. There was no significant bleeding. The patient was recovered in the usual manner and brought back to her room. Family was present and they were updated as to the procedure. Will await results.   ____________________________ Yevonne PaxSaadat Ball. Klare Criss, MD sak:drc D: 12/31/2011 13:26:03 ET T: 12/31/2011 13:49:36 ET JOB#: [id number]  cc: Yevonne PaxSaadat Ball. Amatullah Christy, MD, <Dictator> Yevonne PaxSAADAT Ball Claude Swendsen MD ELECTRONICALLY SIGNED 01/11/2012 11:45

## 2014-09-23 NOTE — Consult Note (Signed)
PATIENT NAME:  Ashley Ball, Ashley Ball MR#:  [id number] DATE OF BIRTH:  Oct 17, 1945  DATE OF CONSULTATION:  12/29/2011  CONSULTING PHYSICIAN:  Ashley Neuobert G. Lorre NickGittin, Ball  REASON FOR CONSULTATION: Ashley Ball is Ball 69 year old patient who takes primary care with Ashley Ball at [facility] and is also seeing Dermatology for psoriasis, including treatment with Ashley Ball.  The patient was admitted July 24th with increased swelling in the right arm that started Ball few weeks ago in the shoulder and neck but was more obvious in the arm recently. She reported Ball 20-pound weight loss over Ball few months. She was found to have extensive clot in the upper extremities and right internal jugular vein. There was elevated white count, and the CT showed mass in the right apex. The patient was hospitalized and started on heparin. She was also treated with Levaquin for possible cellulitis and intravenous fluids as well as potassium replacement. The patient was identified with tachycardia, but without fever, and also had malnutrition with Ball low albumin which has been attributed to alcohol use.   PAST MEDICAL HISTORY:  1. History of asthma. 2. Alcoholic cirrhosis. 3. Psoriasis.   PAST SURGICAL HISTORY: Cholecystectomy.   ALLERGIES: Ibuprofen and penicillin.   MEDICATIONS:  1. Albuterol inhaler p.r.n.  2. Ashley Ball injections.   SOCIAL HISTORY: She is Ball regular smoker and continues to smoke half pack Ball day. She has Ball positive history of alcohol in the past. The patient says she did not drink in the last few days and was Ball heavy drinker years ago but admitted to the admitting physician that she might drink 40 ounces of beer at Ball time. No history of withdrawal. She has had distant history of drug use.   FAMILY HISTORY: Mother had cirrhosis. No malignancy.   REVIEW OF SYSTEMS: She had related night sweats to the admitting physician, did not relate that to me, but no fever or chills. She has had Ball weight loss and some appetite loss.  Not hard of hearing. No ear or jaw pain. No pharyngitis or dysphagia. No palpitations, retrosternal chest pain. She was not short of breath. No hemoptysis. Had some nausea and decreased appetite. No vomiting. She denied any change in bowel habits or blood in stools. She had given the admitting physician Ball history of occasional black stools, unknown if that relates to diet. No dysuria or hematuria. The right arm was swollen and painful recently. No history of seizure or stroke. No hot or cold intolerance. No prior anemia. No easy bleeding or bruising.   PHYSICAL EXAMINATION:  GENERAL: Had Ball low-grade temperature on admission, some tachycardia which has improved. Blood pressure is good and normal.   HEENT: Sclerae are clear. Mouth: No thrush.   NECK: There is swelling in the neck, and there was Ball small slightly mobile but hard nodule in the supraclavicular area. The lungs were clear. No wheezing or rales.   HEART: Regular.   ABDOMEN: Nontender. No palpable mass or organomegaly.   EXTREMITIES: No edema.   NEUROLOGIC: Grossly nonfocal. Cranial nerves are intact. Alert and cooperative.   LABORATORY, DIAGNOSTIC, AND RADIOLOGICAL DATA: On admission, the urine esterase was positive, there was +1 plus blood. The white count was 26,000, hemoglobin 11.3, platelets 258. Liver chemistries and AST minimally elevated. Albumin was 2.0. The INR was 1.2. PTT was 30.5. The pro time was 15.5. Ultrasound of the right arm showed extensive thrombus. CT of the neck and chest was done showing Ball right upper lobe mass. PET  scan was done that shows uptake in the right upper lobe, also uptake in or adjacent to the jugular vein and the supraclavicular adenopathy as well as posterior mediastinal and subcarinal node and uptake in an area in the sacrum.   IMPRESSION AND PLAN:  1. Extensive clot that looks like it is due to obstruction from an apical mass. There has been some question and consideration given to but no definite  evidence of tumor thrombus.  2. Alcoholic cirrhosis by history, trivially elevated pro time.  3. Alcohol abuse. No current signs of withdrawal.  4. There was low albumin that has been attributed to alcohol.  5. There is Ball weight loss recently that would affect the albumin, might be related to malignancy.  6. Some history of dark stools. No definite history of bleed.  7. Distant history of drug use.   PLAN: I have viewed the PET scan. I discussed with Dr. Cherlynn Ball and also with Radiology and Vascular Surgery and Thoracic Surgery. The plan is to continue with heparin at the current time, and the surgeons will evaluate for Ball supraclavicular node biopsy. We will get histology and all the molecular markers. The most likely this is squamous cancer, potentially could be adenocarcinoma, potentially could have molecular markers that make the patient appropriate for targeted therapy; but it is more likely that this will be Ball non-small cell cancer to be treated with chemotherapy and radiation, although at this point it looks like an unresectable cancer. Can check stool guaiacs and iron studies. I would check Ball hepatitis C and HIV with the social history. There is leukocytosis that almost surely reactive due to infection, but I would want to know if the BCR for CML was positive, so we will just get that documented. As per Vascular Surgery, might want to pursue  studies or Ball venogram to make sure there is no tumor thrombus. We will consult radiation therapy as well. Most likely the patient will, after biopsy, have Ball port and continued anticoagulation and local regional therapy with Taxol, carboplatin,  and radiation treatment as initial treatment, but pathology is still pending.  ____________________________ Ashley Neu. Lorre Nick, Ball rgg:cbb D: 12/29/2011 19:24:39 ET T: 12/30/2011 10:05:54 ET JOB#: [id number]  cc: Ashley Neu. Lorre Nick, Ball, <Dictator> Ashley Ball ELECTRONICALLY SIGNED 01/25/2012 13:00

## 2014-09-23 NOTE — Op Note (Signed)
PATIENT NAME:  Ashley LovingsHOLMES, Lakela A MR#:  [id number] DATE OF BIRTH:  [birth year]  DATE OF PROCEDURE:  01/16/2012  PREOPERATIVE DIAGNOSES:  1. Deep vein thrombosis while on therapeutic anticoagulation.  2. Stage IV lung cancer.   POSTOPERATIVE DIAGNOSES:  1. Deep vein thrombosis while on therapeutic anticoagulation.  2. Stage IV lung cancer.   PROCEDURES:  1. Ultrasound guidance for vascular access, right femoral vein.  2. Fluoroscopic guidance for placement of catheter.  3. Inferior venacavogram.  4. Placement of a [employer] Mer[employer] IVC filter.   SURGEON: Annice NeedyJason S. Dew, MD  ANESTHESIA: Local with moderate conscious sedation.   ESTIMATED BLOOD LOSS: Minimal.   FLUOROSCOPY TIME: Less than one minute.   CONTRAST USED: 15 mL.   INDICATION FOR PROCEDURE: 69 year old white female with severe stage IV lung cancer. She had intervention several weeks ago for right upper extremity deep vein thrombosis. She was admitted with new leg pain and swelling this weekend and found to have new bilateral lower extremity DVTs while her INR was greater than 2. With a new deep venous thrombosis on anticoagulation she was a candidate for an IVC filter. Risks and benefits were discussed and she desired to proceed.   DESCRIPTION OF PROCEDURE: Patient was brought to the vascular interventional radiology suite, groins shaved and prepped, sterile surgical field was created. Ultrasound was used to visualize a patent right femoral vein. It was then accessed under direct ultrasound guidance without difficulty with a Seldinger needle. A J-wire was placed after skin nick and dilatation, the delivery sheath was placed into the inferior vena cava and inferior venacavogram was performed. This showed the level of the renal veins at approximately L1 and then I deployed the IVC filter using fluoroscopic guidance at the top of L2 below the level of the renal veins. The delivery sheath was removed. Pressure was held. Sterile dressing was  placed. The patient tolerated procedure well.   ____________________________ Annice NeedyJason S. Dew, MD jsd:cms D: 01/16/2012 08:55:09 ET T: 01/16/2012 12:20:47 ET JOB#: [id number]  cc: Annice NeedyJason S. Dew, MD, <Dictator> Annice NeedyJASON S DEW MD ELECTRONICALLY SIGNED 01/16/2012 15:50

## 2014-09-23 NOTE — Consult Note (Signed)
Reason for Visit: This 69 year old Female patient presents to the clinic for initial evaluation of  Lung cancer .   Referred by Dr. Neale BurlyGitten.  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   Pathology Report Pathology cytology were reviewed    Imaging Report PET CT scan MRI brain reviewed    Referral Report Clinical notes reviewed    Planned Treatment Regimen Split course radiation therapy to chest with possible concurrent chemotherapy    HPI   patient is a 69 year old female patient [facility] July 24 2013[facility] was increasing swelling in her right arm a 20 pound weight loss over the past few months. She was found to have extensive clot in the right upper extremity and right internal jugular vein. Most likely this is caused by superior vena cava syndrome with tumor abutting and growing into her mediastinum with atelectasis of the right upper lobe. Patient was hospitalized started on heparin and Levaquin for possible cellulitis. Bronchoscopy and transnasal biopsy was positive for non-small cell lung cancer. She has had lytic therapy to her right upper extremity although at this time arm is starting to increase in size with increasing redness and swelling. She is now referred to radiation oncology for consideration of treatment.  Past Hx:    Lung cancer: Jul 2013   etoh abuse:    Hx of Hepatitis C:    Hiatal hernia:    PSORIASIS:    Asthma:    Tubal pregnancy:    Oophorectomy:    Appendectomy:   Past, Family and Social History:   Past Medical History positive    Respiratory asthma    Gastrointestinal Hiatal hernia    Infections hepatitis C    Past Surgical History appendectomy; TAH/BSO    Family History positive    Family History Comments Mother with cirrhosis no family history of cancer    Social History positive     Social History Comments Significant EtOH abuse history, greater than 50-pack-year smoking history   Allergies:   Ibuprofen: GI Distress  Penicillin: Unknown  Home Meds:  Home Medications: Medication Instructions Status  levofloxacin 500 mg oral tablet 1 tab(s) orally every 24 hours Active  triamcinolone 0.05% topical ointment Apply topically to affected area 2 times a day Active  albuterol CFC free 90 mcg/inh inhalation aerosol 2 puff(s) inhaled every 6 hours as needed for shortness of breath Active  warfarin 5 mg oral tablet 1 tab(s) orally once a day Active  oxycodone 5 mg oral tablet 1 tab(s) orally every 6 hours, As needed, pain Active   Review of Systems:   General negative    Performance Status (ECOG) 1    Skin see HPI    Breast negative    Ophthalmologic negative    ENMT negative    Respiratory and Thorax see HPI    Cardiovascular see HPI    Gastrointestinal negative    Genitourinary negative    Musculoskeletal negative    Neurological negative    Psychiatric negative    Hematology/Lymphatics negative    Endocrine negative    Allergic/Immunologic negative   Nursing Notes:  Nursing Vital Signs and Chemo Nursing Nursing Notes: *CC Vital Signs Flowsheet:   06-Aug-13 15:31   Temp Temperature 99.7   Pulse Pulse 114   Respirations Respirations 22   SBP SBP 105   Pain Scale (0-10)  9  Current Weight (kg) (kg) 57.3   Height (cm) centimeters 165.5   BSA (m2) 1.6   Physical Exam:  General/Skin/HEENT:   Eyes normal    ENMT normal    Head and Neck normal    Additional PE Well-developed female in moderate pain distress. She has obvious swelling of her right upper extremity. Swelling extends from her axilla down into her fingertips. She has some fullness in the right supraclavicular fossa. Decreased breath sounds in the right upper lobe. Remainder of lung fields are clear. Cardiac examination shows regular rate and rhythm. No collateral venous  circulation or venous jugular distention is identified.   Breasts/Resp/CV/GI/GU:   Respiratory and Thorax normal    Cardiovascular normal    Gastrointestinal normal    Genitourinary normal   MS/Neuro/Psych/Lymph:   Musculoskeletal normal    Neurological normal   Assessment and Plan:  Impression:   at least stage IIIB non-small cell lung cancer in 69 year old female with obvious. Her, and thrombosis in her right jugular vein. Patient may indeed have stage IV disease with PET avid in her sacrum.  Plan:   at this time believe we need to go ahead with radiation therapy in a palliative mode. Would plan on split course therapy. I would start out with 4000 cGy to her right lung right supraclavicular fossa and right mediastinal lymph nodes based on her PET/CT scan. Would then reevaluate after 4 weeks it with a two-week break and possibly add small field lung boost should the patient tolerated her treatments well and we see good response by CT criteria. Have talked to the case with Dr. Neale Burly. Would also advocate radiation sensitizing chemotherapy with a platinum-based regimen if the patient is able to tolerate that. Further workup for targeted therapies can be achieved to down the road should further tissue diagnosis be necessary. Risks and benefits of treatment including increasing dysphasia, skin reaction, increasing cough and alteration of blood counts were all explained in detail to the patient and her family. I have set her up for CT simulation later this week. Dr. Neale Burly we will continue to manage her right upper extremity edema and possible early cellulitis. Palliative radiation therapy may be reserved for her sacral lesion should that increase in size and symptoms over time.  I would like to take this opportunity to thank you for allowing me to continue to participate in this patient's care.  CC Referral:   cc: Dr. Angus Palms   Electronic Signatures: Rushie Chestnut Gordy Councilman (MD)  (Signed  06-Aug-13 16:32)  Authored: HPI, Diagnosis, Past Hx, PFSH, Allergies, Home Meds, ROS, Nursing Notes, Physical Exam, Encounter Assessment and Plan, CC Referring Physician   Last Updated: 06-Aug-13 16:32 by Rebeca Alert (MD)

## 2014-09-23 NOTE — Consult Note (Signed)
Patient with extensive RUE swelling secondary to extensive RUE and central venous clot.  Started on anticoagulation.  Likely secondary to a malignancy.  Can consider thrombolysis next week if not contraindicated from tumor and swelling/pain are not dramatically improved with anticoagulation alone. Will follow  Electronic Signatures: Annice Needyew, Jason S (MD)  (Signed on 25-Jul-13 16:36)  Authored  Last Updated: 25-Jul-13 16:36 by Annice Needyew, Jason S (MD)

## 2014-09-23 NOTE — Consult Note (Signed)
PATIENT NAME:  Ashley Ball, EDGIN MR#:  [id number] DATE OF BIRTH:  02/17/1946  DATE OF CONSULTATION:  04/06/2012  REFERRING PHYSICIAN:  Dr Lorre Nick CONSULTING PHYSICIAN:  Rosalyn Gess. Ziare Orrick, MD  REASON FOR CONSULTATION: Fever.   HISTORY OF PRESENT ILLNESS: The patient is a 69 year old white female with a past history significant for stage IV lung cancer receiving chemotherapy with DVT and PE who has failed Coumadin and allergic reaction possibly to Lovenox versus Levaquin who was admitted with fevers and chills. The patient is a very poor historian and is not able to provide much history. She denies knowing that she had any temperature at home. She says that occasionally she takes her temperature but has not had any elevations. Her temperature elevation was noted in the Cancer [facility]. She states that she has been breathing fairly well. She has not had any cough or sputum production. She denies any urinary symptoms. Per the history and physical, she was apparently feeling sick with fevers and chills and weakness and dizziness. She states that she does not recall these currently. She has a history of both deep venous thrombosis and PE and was on Coumadin therapy and despite an elevated INR developed further clotting. She was then switched to Lovenox and was taking Levaquin at the same time. She subsequently developed a red papular rash over her legs. Dermatology biopsied this, and it was determined to be a probable allergic reaction. Both drugs were stopped, and she is now on Arixtra but there is some concern over her compliance with the therapy. She was started on Cipro and azithromycin in house. Blood cultures had been obtained and had been negative. A urine culture was obtained on 04/04/2012 which grew group B strep. A UA was not obtained until today, which was essentially negative. Chest x-rays from admission show decrease in the consolidative density in the right upper lobe There were some residual areas of  increased density in the right upper lobe with a nodule component. However, her Dr. Lorre Nick, PET CT scans performed in the Cancer Center have demonstrated improvement in her lung cancer with the radiation.   ALLERGIES: Ibuprofen, penicillin, and possibly either Lovenox or levofloxacin.   PAST MEDICAL HISTORY:  1. Lung cancer, stage IV, receiving radiation.  2. 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  3. Cirrhosis.  4. Hepatitis C infection.  5. Chronic obstructive pulmonary disease.  6. Psoriasis.  7. Alcoholism.   FAMILY HISTORY: Positive for coronary artery disease and cirrhosis.   SOCIAL HISTORY: She continues to smoke half pack cigarettes per day. Positive alcohol history although she denies it currently. Has a history of drug use 14 to 15 years ago but none recently. She lives with her daughter and another relative.   REVIEW OF SYSTEMS. GENERAL: She denies any fevers, chills, sweats, or malaise. HEENT: No headaches. No sinus congestion. No sore throat. NECK: No stiffness. No swollen glands. RESPIRATORY: No cough. Some shortness of breath with exertion but no worsening from her usual. No sputum production. No wheezing. CARDIAC: She has occasional chest pains with exertion. No palpitations. No peripheral edema. GI: No nausea, no vomiting, no abdominal pain, no change in her bowels. GU: No change in her urine. MUSCULOSKELETAL: No complaints. SKIN: She has a rash which is highly pruritic that has been present for several weeks. NEUROLOGIC: No focal weakness. PSYCHIATRIC: No complaints. All other systems are negative.   PHYSICAL EXAMINATION:  VITAL SIGNS: T-max of 102.3, T-current 98.7, pulse 99, blood pressure 97/52, 98%  on room air.   GENERAL: A 69 year old white female with small stature but in no acute distress.   HEENT: Normocephalic, atraumatic. Pupils equal, reactive to light. Extraocular motion intact. Sclerae, conjunctivae, and lids are without evidence for emboli or  petechiae. Oropharynx shows no erythema or exudate. The patient is edentulous.   NECK: Midline trachea. No lymphadenopathy. No thyromegaly.   CHEST: Clear to auscultation bilaterally with good air movement. No focal consolidation.   HEART: Regular rate and rhythm without murmur, rub, or gallop.   ABDOMEN: Soft, nontender, nondistended. No hepatosplenomegaly. No hernia is noted.   EXTREMITIES: No evidence for tenosynovitis.   SKIN: She had multiple papular lesions over the legs that were erythematous. They were not coalesced but were grouped together. She has some areas of secondary excoriation. There was some surrounding erythema. There were no stigmata of endocarditis, specifically no Janeway lesions or Osler nodes.   NEUROLOGIC: The patient was awake and interactive, moving all four extremities. She was a somewhat poor historian, however.   PSYCHIATRIC: Mood and affect appeared normal.   LABORATORY DATA: BUN of 8, creatinine 0.54, bicarbonate 25, anion gap of 10. LFTs shows an AST 39, ALT 24, alkaline phosphatase 127, total bilirubin of 0.3. White count of 24.3 with a hemoglobin 10.8, platelet count of 410, ANC of 21.3. White count on admission was 30.0 with an ANC of 27.0. Blood cultures from admission show no growth. A urine culture from admission is growing greater than 1000 CFUs per mL of group B strep and 30,000 CFU per mL of a gram-positive cocci that has not been identified. A urinalysis from today essentially was negative. A chest x-ray from 04/03/2012 showed a decrease in the consolidative density in the right upper lobe. There were residual areas of increased density with nodular component. A chest x-ray from today shows mild heterogeneous opacities in the right mid and upper hemithorax that are unchanged. There is no change in the collapse of the right upper lobe.   IMPRESSION: A 69 year old white female with a history of lung cancer, deep venous thrombosis, and pulmonary embolus  with Coumadin failure, recent rash, possibly from Lovenox or Levaquin who was admitted with fevers and chills.   RECOMMENDATIONS:  She is a relatively poor historian. In discussion with Dr. Lorre Nick, it is unclear if her rash is from levofloxacin or Lovenox so she was on both at the time it developed. She did not have fevers with the rash when it first began 6 weeks ago. She is currently on Cipro which is another fluoroquinolone. She has had significant clotting issues as well as with concern over compliance with her anticoagulation. Per her evaluations in the Cancer Center, her cancer appears to be responding to therapy. She does not have sputum production or significant shortness of breath. Possible etiologies of her fever include postobstructive pneumonia, deep vein thrombosis or PE,  drug reaction to the Cipro (although the fever was present in the office prior to starting the drug),  her underlying hepatitis C (including the possibility of hepatocellular carcinoma although I would have expected this to have shown up on recent PET scanning), or tumor fever.  Will check a CT scan of the chest to look for infiltrates and possible PE.  Would obtain venous Dopplers.  We will change her antibiotics to clindamycin and aztreonam. This will provide broad coverage for the lungs.  Her urinalysis is unremarkable. This makes the strep in the urine less of a concern. This will be covered by the  above regimen, however.   This is a high-level infectious disease consult. Thank you very much for involving me in Ms. Bartoszek' care.      ____________________________ Rosalyn GessMichael E. Saabir Blyth, MD meb:vtd D: 04/06/2012 16:01:49 ET T: 04/07/2012 08:26:04 ET JOB#: [id number]  cc: Rosalyn GessMichael E. Kaydin Labo, MD, <Dictator> Nayomi Tabron E Walter Grima MD ELECTRONICALLY SIGNED 04/12/2012 12:25

## 2014-09-23 NOTE — Discharge Summary (Signed)
PATIENT NAME:  Ashley Ball, Ashley Ball MR#:  [id number] DATE OF BIRTH:  [birth year]  DATE OF ADMISSION:  12/28/2011 DATE OF DISCHARGE:  01/04/2012  PRIMARY CARE PHYSICIAN: Angus PalmsSionne George, MD  FINAL DIAGNOSES:  1. 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 Wyn Quakerew.  2. Non-small cell lung cancer, new diagnosis.  3. Postoperative fever and hypotension.  4. Postobstructive pneumonia.  5. Leukocytosis.  6. Cirrhosis.  7. Alcohol abuse.  8. Tobacco abuse.  9. Urinary tract infection with drug-resistant Escherichia coli.  10. Psoriasis.   DISCHARGE MEDICATIONS:  1. Albuterol inhaler 2 puffs every six hours as needed for shortness of breath.  2. Oxycodone 5 mg orally every six hours. 3. Coumadin 5 mg every day at 5:00 p.m.  4. Triamcinolone cream 0.05% topical ointment twice Ball day.  5. Colace 100 mg twice Ball day. 6. Levaquin 250 mg orally daily for completion of the course. 7. Nicotine patch 14 mcg to chest wall daily.   DIET: Regular diet.   ACTIVITY: As tolerated.  DISCHARGE FOLLOWUP: Followup with Dr. Lorre NickGittin Friday to check INR and to set up treatment for non-small cell cancer. Follow-up in 2 to 4 weeks with Dr. Greggory StallionGeorge.   REASON FOR ADMISSION: The patient was admitted 12/28/2011 by me and discharged 01/04/2012. Initially came in with swollen arm and neck.   HISTORY OF PRESENT ILLNESS: This is Ball 69 year old woman who came in with swelling of the neck and right arm, lost 20 pounds over the past few months. In the Emergency Room, she was found to have an extensive occlusive thrombus in the right upper extremity and right internal vein and possible soft tissue mass versus adenopathy in the right neck and found to have an elevated white count. For the extensive deep vein thrombosis, I sent off Ball hypercoagulable work-up, started on heparin drip and Coumadin. Vascular consult was ordered. Ball CT scan of the chest and neck was done to rule out  malignancy flora. For the systemic inflammatory response syndrome, tachycardia, and leukocytosis IV Levaquin was started. For her tobacco abuse, smoking cessation was done, nicotine patch applied. For her alcohol abuse, she was placed on see where CIWA protocol. She did also have hypokalemia and hyponatremia and she was given potassium replacement and IV fluids. For malnutrition, albumin 2.0, most likely related to alcohol   CONSULTANTS:  1. Freda MunroSaadat Khan, MD - Pulmonary. 2. Benita Gutterobert Gittin, MD - Oncology. 3. Festus BarrenJason Dew, MD - Vascular Surgery. 4. Hulda Marinimothy Oaks, MD - Cardiothoracic Surgery.  PROCEDURES DURING HOSPITAL STAY: Bronchoscopy by Dr. Freda MunroSaadat Khan on 12/30/2011 and 01/02/2012.  The patient had catheter-directed thrombolysis and thrombectomy and then angioplasty of the extensive clot in the right upper extremity.   LABS/RADIOLOGIC STUDIES: Ultrasound of the upper extremity which showed extensive occlusive thrombus in the right upper extremity, right internal jugular vein, and possible soft tissue mass versus adenopathy in the right neck.   PT 15.5, INR 1.2, PTT 30.5. Glucose 113, BUN 7, creatinine 0.51, sodium 133, potassium 3.0, chloride 96, CO2 28, and calcium 8.2. Liver function tests: AST slightly elevated at 45, albumin low at 2.0. White blood cell count 26.4, hemoglobin and hematocrit 11.3 and 35.5, and platelet count 258.   Urine culture grew out Escherichia coli, which did have some resistance.   Urinalysis showed 1+ leukocyte esterase.  CT scan of the neck and chest showed right upper lobe consolidation, thrombosis of the right internal jugular vein and right subclavian vein with adenopathy  in the mediastinum, right hilar, and supraclavicular region. PET scan may be beneficial.  Anticardiolipin antibody IgG slightly high at 31. Anticardiolipin IgM 11. Beta-2 glycoprotein negative. Antithrombin-3 low at 74%. Antithrombin antigen 82%. Protein S and protein C: Protein C antigen low at 59  and protein S total 133% and free 113%, which is in normal range. Factor V Leiden mutation, I am not seeing the result there. Factor II mutation analysis, no mutation identified. Magnesium 1.5. Alpha-fetoprotein 1.7.  PET scan showed findings with Ball central obstructing mass in the right upper lobe bronchus region with supraclavicular and mediastinal adenopathy and occlusion of the right internal jugular and possibly tumor thrombus. Also abnormal localization of the left sacrum, could be bony metastases.  Ferritin 712. Iron binding capacity 190. Iron serum 31. Iron saturation 16%. The chromosomal analysis for blood is pending. Hepatitis C RNA positive. HIV nonreactive.   Bronchial washings: One was nondiagnostic. The other bronchial washing was positive for malignant cells, non-small cell carcinoma present.  Fine needle aspiration also positive for malignant cells. Non-small cell carcinoma present. No fungus.  Bronchial washing culture: Normal flora.   Acid-fast nonreactive.   MRI of the brain with and without contrast showed no evidence of metastatic disease to the brain, chronic small vessel ischemia.  Occult blood negative. Blood cultures on 01/03/2012 negative. Urine culture negative upon discharge.   INR 2.6 upon discharge.   Repeat chest x-ray upon discharge shows dense consolidation persists in the right upper lobe, small right pleural effusion. Left lung is clear.   HOSPITAL COURSE PER PROBLEM LIST:  1. For the right upper extremity and internal jugular deep vein thrombosis, the patient was initially started on heparin and Coumadin. He underwent thrombolysis and thrombectomy by Dr. Wyn Quaker. The patient is maintained on Coumadin upon discharge, INR 2.6. The patient will have close clinical follow-up with Dr. Lorre Nick on Friday to check the INR. Coumadin dose is decreased down to 5 mg. The patient had been receiving 6 mg during the hospital course. Again, INR is therapeutic upon discharge, 2.6.   2. For the patient's non-small lung cancer, most likely the cause of the patient's right upper extremity and jugular deep vein thrombosis. This is Ball new diagnosis diagnosed by bronchoscopy by Dr. Welton Flakes. The patient will follow-up with Dr. Lorre Nick as outpatient. Most likely this is Ball nonsurgical case since the bone scan did light up in the sacrum and supraclavicular nodes. Most likely chemotherapy and radiation will be done as an outpatient, but will leave this to Dr. Lorre Nick.  3. For the patient's postoperative fever and hypotension, after the thrombolysis the patient had Ball fever of 101. This had resolved. Blood cultures and urine cultures, repeat, were negative. Chest x-ray showed no pneumonia. The patient was discharged home afebrile on 01/04/2012.  4. For the patient's postobstructive pneumonia, the patient was started on Levaquin from admission and switched over to meropenem with the urine culture and then switched back to Levaquin to complete Ball 10 day course of antibiotics.  5. Leukocytosis. May be secondary to the cancer. This had not improved during the hospital course.  6. Cirrhosis. The patient does have positive hepatitis C and alcohol history. Alpha-fetoprotein is negative. The patient was advised against alcohol consumption.  7. Alcohol abuse. The patient was put on CIWA protocol but did not require any medication for this. The patient was advised against alcohol consumption.  8. Tobacco abuse. Smoking cessation counseling done, three minutes by me during the hospital course, nicotine patch applied.  9. Urinary tract infection with Escherichia coli with drug resistance. Antibiotic was switched over to meropenem and given Ball course of that which would treat the urine. Urine culture upon discharge negative.  10. Psoriasis. The patient was getting injections as an outpatient for this. These injections are now contraindicated secondary to the malignancy. The patient was receiving triamcinolone cream in  the hospital. Can continue this as an outpatient.    The patient will have close clinical follow-up with Dr. Lorre Nick for INR check and cancer treatments.   TIME SPENT ON DISCHARGE: 35 minutes.  ____________________________ Herschell Dimes. Renae Gloss, MD rjw:slb D: 01/04/2012 15:45:02 ET T: 01/05/2012 12:11:32 ET JOB#: [id number]  cc: Herschell Dimes. Renae Gloss, MD, <Dictator> Angus Palms, MD Knute Neu. Lorre Nick, MD Salley Scarlet MD ELECTRONICALLY SIGNED 01/12/2012 13:41

## 2014-09-23 NOTE — Consult Note (Signed)
PATIENT NAME:  Ashley Ball, Ashley Ball MR#:  [id number] DATE OF BIRTH:  Feb 11, 1946  DATE OF CONSULTATION:  12/29/2011  REFERRING PHYSICIAN:   CONSULTING PHYSICIAN:  Yevonne PaxSaadat Ball. Khan, MD  HISTORY OF PRESENT ILLNESS: The patient is Ball 69 year old female who presents to the hospital with Ball swollen arm and neck. The patient has Ball history of smoking, 2 packs Ball day. She also has been having some weight loss. She's having some cough and some congestion. The patient has some tightness in the chest. She had Ball CT scan done of the chest on admission and it showed right upper lobe consolidation and also she had thrombus in the right internal jugular vein as well as the subclavian vein. The patient had Ball PET scan done today and the PET scan shows very high SUV of 22 and there was also Ball central obstructing mass in the right upper lobe bronchus in the region of the supraclavicular and mediastinal adenopathy. The patient has been seen by Vascular Surgery by Dr. Wyn Quakerew and is considering thrombolytics. I think at first, however, we need to have Ball diagnosis made.   PAST MEDICAL HISTORY:  1. Asthma. 2. Cirrhosis. 3. Psoriasis.   PAST SURGICAL HISTORY: Cholecystectomy.   ALLERGIES: Penicillin and ibuprofen.   MEDICATIONS: Medications are reviewed on the electronic medical record.   REVIEW OF SYSTEMS: Full 12 point review of system was done and was unremarkable.   FAMILY HISTORY: Positive for cirrhosis.   SOCIAL HISTORY: Positive for smoking. She still drinks also. The patient does also have Ball history of drug abuse.      PHYSICAL EXAMINATION:   VITAL SIGNS: At the present time, temperature 99.5, pulse 103, respiratory rate 18, blood pressure 119/68.   NECK: Supple. There was no JVD. No thyromegaly noted.   HEENT: Extraocular movements grossly intact.   CHEST: Coarse breath sounds and rhonchi diminished on the right.   CARDIOVASCULAR: S1, S2 was normal. Regular rhythm. No gallop or rub.   ABDOMEN: Soft,  nontender.   EXTREMITIES: Without cyanosis or clubbing. Pulses equal.   NEUROLOGIC: She was awake and alert, moving all four extremities. Gait was not checked.   LABORATORY DATA: White count 22.5, hemoglobin 11.1. The chemistry shows BUN 7, creatinine 0.47. Her coags were within normal limits at this time.   IMPRESSION: Obstructing right upper lobe lesion, probable primary lung cancer, probably has metastatic disease to lymph nodes. I spoke to the patient. I'm going to stop her Coumadin. I do not think she has received Ball dose as of yet so this will be discontinued until after the procedure is done. We will perform bronchoscopy tomorrow for diagnostic purposes and will follow. Further recommendations as needed.   ____________________________ Yevonne PaxSaadat Ball. Khan, MD sak:drc D: 12/29/2011 17:19:59 ET T: 12/30/2011 09:31:28 ET JOB#: [id number]  cc: Yevonne PaxSaadat Ball. Khan, MD, <Dictator> Yevonne PaxSAADAT Ball KHAN MD ELECTRONICALLY SIGNED 12/31/2011 13:32

## 2014-09-23 NOTE — Consult Note (Signed)
Impression: [name] w/ h/o lung CA, DVT/PE with coumadin failure, recent rash possibly from lovenox or levofloxacin admitted with fever and chills.  She is a relatively poor historian.  In discussion with Dr. Lorre NickGittin, it is unclear if her rash is from levofloxacin or lovenox as she was on both at the time it developed.  She did not have fevers with the rash when it began 6 weeks ago.  She is currently on cipro which is another fluoroquinolone.  She has had significant clotting issues and some concern over compliance with her anticoagulation.  Per her evaluations in the cancer center, her cancer appears to be responding to therapy.  She does not have sputum production or significant SOB.  Possible etiologies of her fever include post obstructive pneumonia, DVT or PE, drug reaction to the cipro (although the fever was present in the office prior to starting the drug), her underlying hep C (including the possibility of hepatocellular CA) or tumor fever. Will check a CT of the chest to look for infiltrates and PE. Venous dopplers. Will change her antibiotics to clinda/aztreonam.  This will provide broad coverage for the lungs. Her u/a is unremarkable, making the Strep in the urine less of a concern.  This will be covered by the above regimen.   Electronic Signatures: Ashley Ball, Rosalyn GessMichael E (MD) (Signed on 01-Nov-13 15:49)  Authored   Last Updated: 01-Nov-13 16:01 by Ceylon Arenson, Rosalyn GessMichael E (MD)

## 2014-09-23 NOTE — Consult Note (Signed)
General Aspect New DVT bilateral lower extremities    Present Illness The patient is a 69 year old female who presents with new onset leg swelling and recently diagnosed non-small cell lung cancer. She has known thrombosis in her right jugular vein. The patient is to have radiation therapy in a palliative mode. She has extensive DVT in right arm and now both legs.left leg occlusive and right leg nonocclusive extensive thrombus:The patient had therapeutic INR while on coumadin. will stop coumadin and consider therapeutic lovenox 1 mg/kg bid for treatment of DVT in the setting of malignancy.  PAST MEDICAL HISTORY:  1. Asthma. 2. Alcoholic cirrhosis. 3. Psoriasis.   PAST SURGICAL HISTORY: Cholecystectomy.   Home Medications: Medication Instructions Status  levofloxacin 500 mg oral tablet 1 tab(s) orally every 24 hours Active  triamcinolone 0.05% topical ointment Apply topically to affected area 2 times a day Active  albuterol CFC free 90 mcg/inh inhalation aerosol 2 puff(s) inhaled every 6 hours as needed for shortness of breath Active  warfarin 5 mg oral tablet 1 tab(s) orally once a day Active  oxycodone 5 mg oral tablet 1 tab(s) orally every 6 hours, As needed, pain Active  alprazolam 0.25 mg oral tablet 1 tab(s) orally 3 times a day, As Needed- for Anxiety, Nervousness, #20 with No Refills... Active    Ibuprofen: GI Distress  Penicillin: Unknown  Case History:   Family History Non-Contributory    Social History negative tobacco, positive ETOH, negative Illicit drugs   Review of Systems:   Fever/Chills No    Cough No    Sputum No    Abdominal Pain No    Diarrhea No    Constipation No    Nausea/Vomiting No    SOB/DOE No    Chest Pain No    Telemetry Reviewed NSR   Physical Exam:   GEN well developed, ill appearing    HEENT pale conjunctivae, PERRL, hearing intact to voice    NECK supple  trachea midline    RESP normal resp effort  no use of accessory  muscles    CARD regular rate  No LE edema  no JVD    ABD denies tenderness  soft    EXTR positive edema, bilateral leg edema and right arm edema    SKIN No rashes, No ulcers    NEURO follows commands, motor/sensory function intact    PSYCH alert, good insight   Nursing/Ancillary Notes: **Vital Signs.:   10-Aug-13 07:00   Vital Signs Type Routine   Temperature Temperature (F) 97.3   Celsius 36.2   Temperature Source Oral   Pulse Pulse 74   Respirations Respirations 18   Systolic BP Systolic BP 90   Diastolic BP (mmHg) Diastolic BP (mmHg) 58   Mean BP 68   Pulse Ox % Pulse Ox % 96   Oxygen Delivery Room Air/ 21 %   Hepatic:  09-Aug-13 19:40    Bilirubin, Total 0.3   Alkaline Phosphatase 113   SGPT (ALT) 14   SGOT (AST)  40   Total Protein, Serum 6.8   Albumin, Serum  1.5  Routine Micro:  09-Aug-13 19:40    Micro Text Report BLOOD CULTURE   COMMENT                   NO GROWTH IN 8-12 HOURS   ANTIBIOTIC                        Culture Comment  NO GROWTH IN 8-12 HOURS  Result(s) reported on 14 Jan 2012 at 08:22AM.    19:50    Micro Text Report BLOOD CULTURE   COMMENT                   NO GROWTH IN 8-12 HOURS   ANTIBIOTIC                        Culture Comment NO GROWTH IN 8-12 HOURS  Result(s) reported on 14 Jan 2012 at 08:22AM.  Routine Chem:  09-Aug-13 19:40    Glucose, Serum 91   BUN 8   Creatinine (comp)  0.56   Sodium, Serum  134   Potassium, Serum 3.7   Chloride, Serum 99   CO2, Serum 27   Calcium (Total), Serum  7.7   Osmolality (calc) 266   eGFR (African American) >60   eGFR (Non-African American) >60 (eGFR values <67m/min/1.73 m2 may be an indication of chronic kidney disease (CKD). Calculated eGFR is useful in patients with stable renal function. The eGFR calculation will not be reliable in acutely ill patients when serum creatinine is changing rapidly. It is not useful in  patients on dialysis. The eGFR calculation may not be applicable to  patients at the low and high extremes of body sizes, pregnant women, and vegetarians.)   Anion Gap 8  Routine Coag:  09-Aug-13 19:40    Prothrombin  24.2   INR 2.1 (INR reference interval applies to patients on anticoagulant therapy. A single INR therapeutic range for coumarins is not optimal for all indications; however, the suggested range for most indications is 2.0 - 3.0. Exceptions to the INR Reference Range may include: Prosthetic heart valves, acute myocardial infarction, prevention of myocardial infarction, and combinations of aspirin and anticoagulant. The need for a higher or lower target INR must be assessed individually. Reference: The Pharmacology and Management of the Vitamin K  antagonists: the seventh ACCP Conference on Antithrombotic and Thrombolytic Therapy. CKVQQV.9563Sept:126 (3suppl): [id number] A HCT value >55% may artifactually increase the PT.  In one study,  the increase was an average of 25%. Reference:  "Effect on Routine and Special Coagulation Testing Values of Citrate Anticoagulant Adjustment in Patients with High HCT Values." American Journal of Clinical Pathology 2006;126:400-405.)  Routine Hem:  09-Aug-13 19:40    WBC (CBC)  28.6   RBC (CBC)  3.24   Hemoglobin (CBC)  9.9   Hematocrit (CBC)  30.1   Platelet Count (CBC) 272   MCV 93   MCH 30.4   MCHC 32.8   RDW 13.8   Neutrophil % 85.0   Lymphocyte % 6.7   Monocyte % 7.5   Eosinophil % 0.2   Basophil % 0.6   Neutrophil #  24.3   Lymphocyte # 1.9   Monocyte #  2.1   Eosinophil # 0.1   Basophil #  0.2 (Result(s) reported on 13 Jan 2012 at 09:19PM.)     Impression 1.  DVT in the face of appropriate anticoagulation         currently the patient is on anticoagulation with lovenox         she should have an IVC filter placed which will be done on Monday 2.  Lung CA          plan per oncology 3.  Asthma         nebulizers         changes per medical service  Plan level 3 consult    Electronic Signatures: Hortencia Pilar (MD)  (Signed 10-Aug-13 14:50)  Authored: General Aspect/Present Illness, Home Medications, Allergies, History and Physical Exam, Vital Signs, Labs, Impression/Plan   Last Updated: 10-Aug-13 14:50 by Hortencia Pilar (MD)
